# Patient Record
Sex: Female | Born: 1985 | Race: Black or African American | Hispanic: No | Marital: Single | Smoking: Never smoker
Health system: Southern US, Community
[De-identification: ages and names within clinical notes are randomized; demographics above are authoritative.]

## PROBLEM LIST (undated history)

## (undated) DIAGNOSIS — F32A Depression, unspecified: Secondary | ICD-10-CM

## (undated) DIAGNOSIS — K5900 Constipation, unspecified: Secondary | ICD-10-CM

## (undated) DIAGNOSIS — F329 Major depressive disorder, single episode, unspecified: Secondary | ICD-10-CM

## (undated) DIAGNOSIS — Z87898 Personal history of other specified conditions: Secondary | ICD-10-CM

## (undated) HISTORY — DX: Personal history of other specified conditions: Z87.898

---
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
## 2006-01-02 ENCOUNTER — Ambulatory Visit: Payer: Self-pay | Admitting: Obstetrics and Gynecology

## 2006-01-02 ENCOUNTER — Encounter (INDEPENDENT_AMBULATORY_CARE_PROVIDER_SITE_OTHER): Payer: Self-pay | Admitting: *Deleted

## 2006-08-19 ENCOUNTER — Emergency Department (HOSPITAL_COMMUNITY): Admission: EM | Admit: 2006-08-19 | Discharge: 2006-08-19 | Payer: Self-pay | Admitting: Family Medicine

## 2006-09-01 ENCOUNTER — Emergency Department (HOSPITAL_COMMUNITY): Admission: EM | Admit: 2006-09-01 | Discharge: 2006-09-01 | Payer: Self-pay | Admitting: Emergency Medicine

## 2007-12-31 ENCOUNTER — Emergency Department (HOSPITAL_COMMUNITY): Admission: EM | Admit: 2007-12-31 | Discharge: 2007-12-31 | Payer: Self-pay | Admitting: Family Medicine

## 2008-05-07 ENCOUNTER — Emergency Department (HOSPITAL_COMMUNITY): Admission: EM | Admit: 2008-05-07 | Discharge: 2008-05-07 | Payer: Self-pay | Admitting: Emergency Medicine

## 2008-05-31 ENCOUNTER — Emergency Department (HOSPITAL_COMMUNITY): Admission: EM | Admit: 2008-05-31 | Discharge: 2008-05-31 | Payer: Self-pay | Admitting: Emergency Medicine

## 2009-03-31 ENCOUNTER — Emergency Department (HOSPITAL_COMMUNITY): Admission: EM | Admit: 2009-03-31 | Discharge: 2009-03-31 | Payer: Self-pay | Admitting: Emergency Medicine

## 2009-06-16 ENCOUNTER — Emergency Department (HOSPITAL_COMMUNITY): Admission: EM | Admit: 2009-06-16 | Discharge: 2009-06-16 | Payer: Self-pay | Admitting: Emergency Medicine

## 2009-08-14 ENCOUNTER — Emergency Department (HOSPITAL_COMMUNITY): Admission: EM | Admit: 2009-08-14 | Discharge: 2009-08-14 | Payer: Self-pay | Admitting: Family Medicine

## 2009-11-21 IMAGING — CR DG ABDOMEN 2V
2 series · 2 of 2 positions shown · non-contrast
Comparison: 09/01/2006

CLINICAL DATA: Constipation.

ABDOMEN - 2 VIEW

[w abdomen upright]
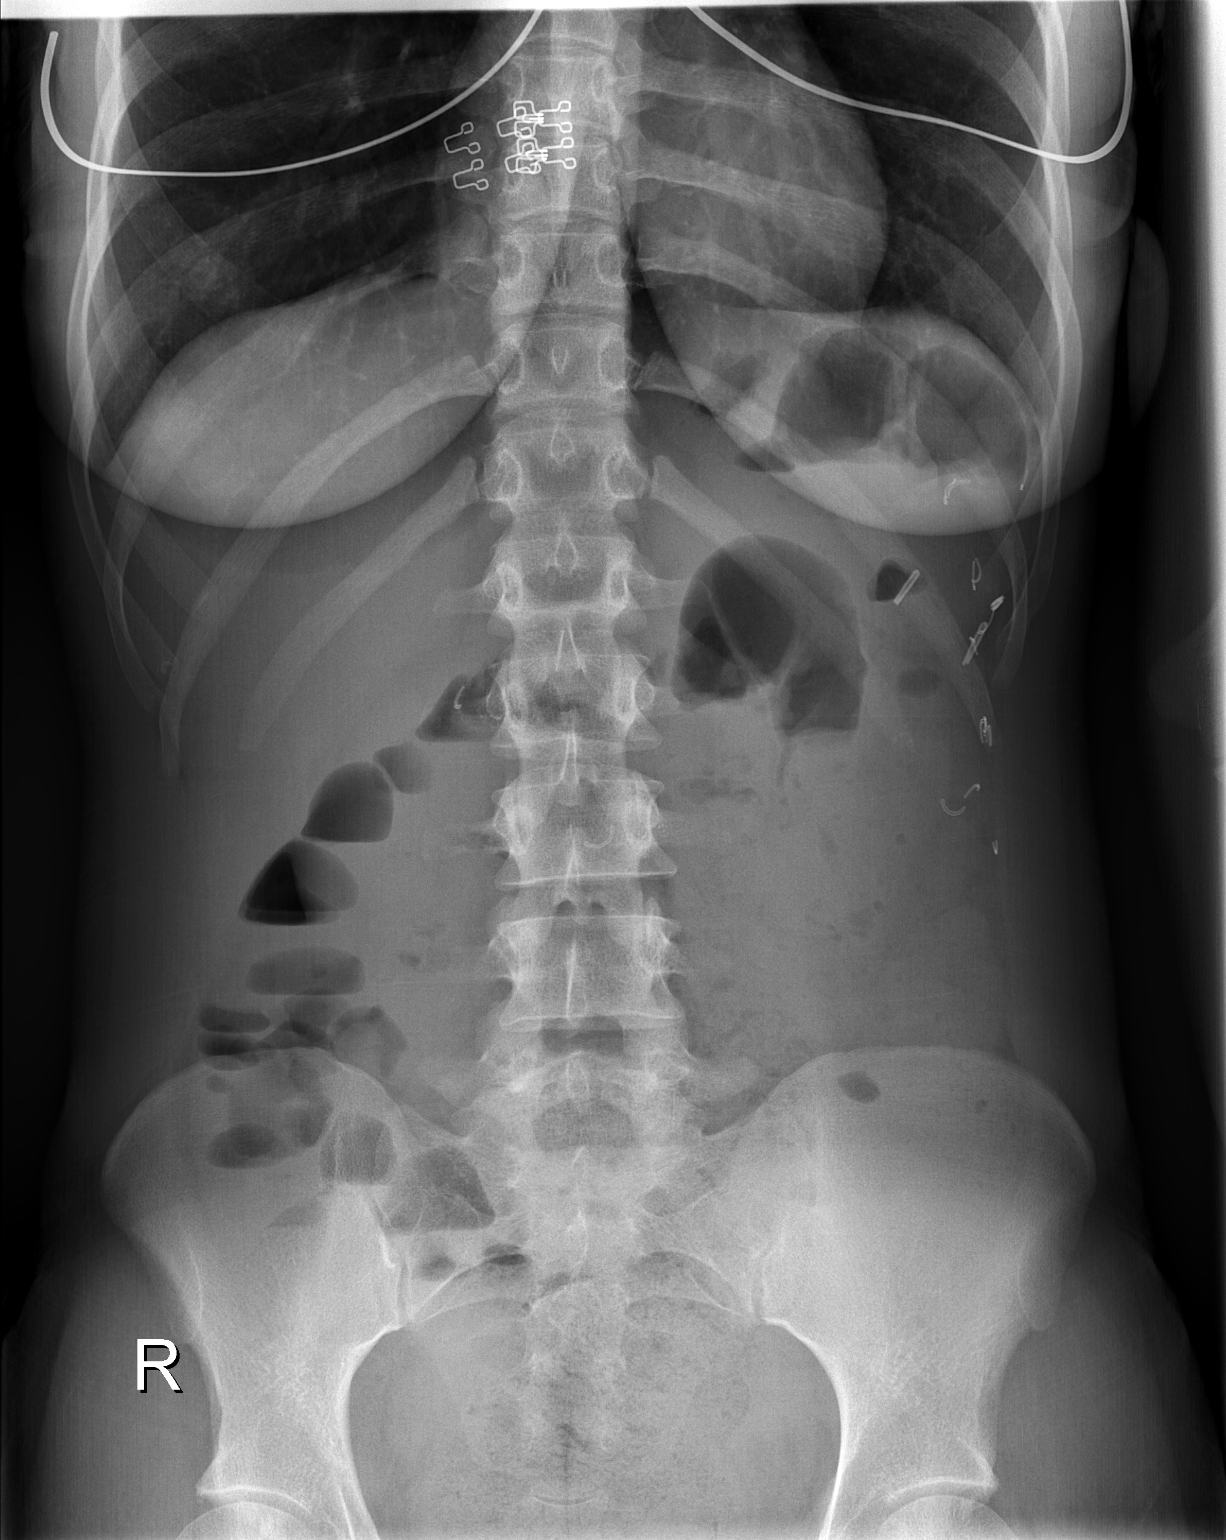

[t abdomen supine]
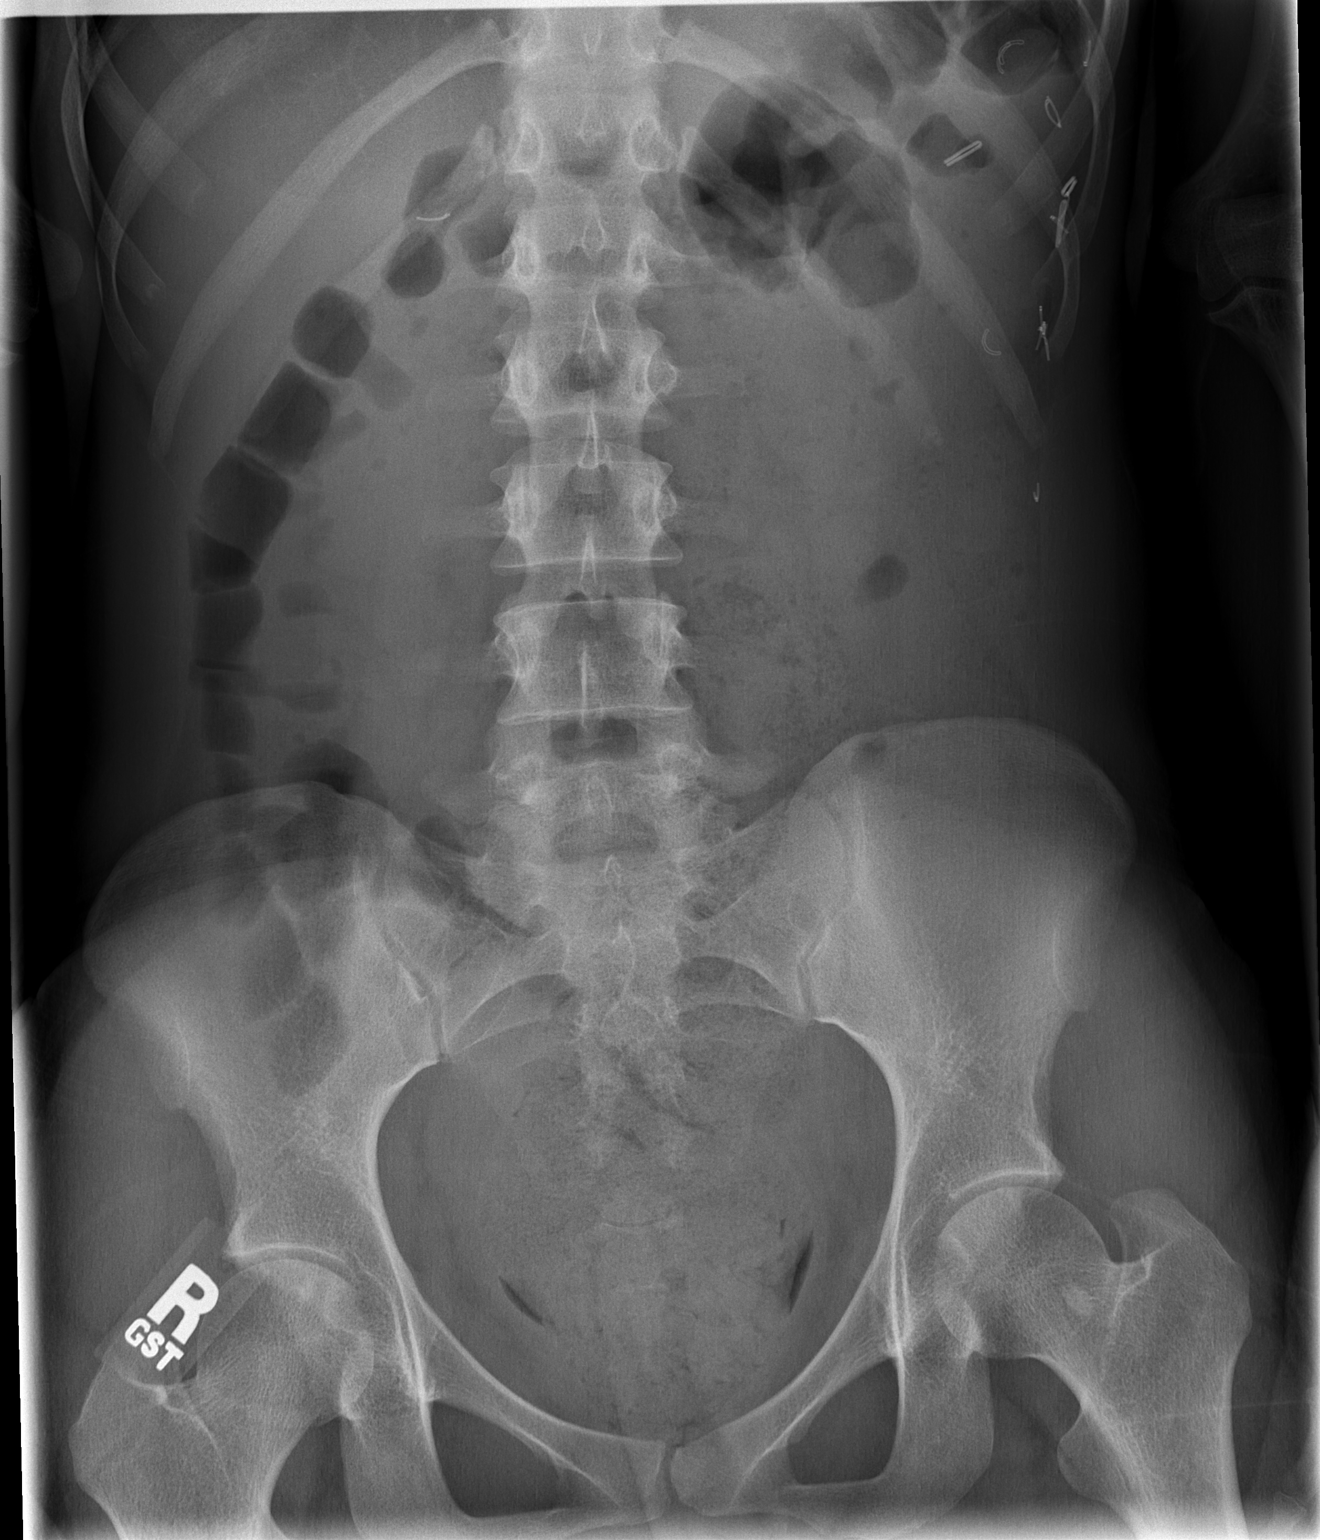

[2 of 2 positions shown; findings below may reference images not displayed]

FINDINGS: Postsurgical changes are present in the left upper
quadrant.  Air fluid levels are present in the right lower
quadrant.  Large amount of stool is present within the rectum and
sigmoid colon.  This raises the possibility of fecal impaction and
obstipation. Based on the appearance, obstruction is difficult to
exclude.
IMPRESSION: 1.  Large stool ball within the rectum.
2.  Postsurgical changes left upper quadrant.
3.  Colonic air fluid levels which are abnormal.  In the setting of
a large fecal burden, question obstipation.

## 2010-07-03 LAB — POCT URINALYSIS DIP (DEVICE)
Bilirubin Urine: NEGATIVE
Glucose, UA: NEGATIVE mg/dL
Ketones, ur: NEGATIVE mg/dL
pH: 6 (ref 5.0–8.0)

## 2010-07-08 LAB — POCT URINALYSIS DIP (DEVICE)
Bilirubin Urine: NEGATIVE
Glucose, UA: NEGATIVE mg/dL
Ketones, ur: NEGATIVE mg/dL
pH: 7 (ref 5.0–8.0)

## 2010-07-08 LAB — POCT PREGNANCY, URINE: Preg Test, Ur: NEGATIVE

## 2010-07-16 LAB — POCT URINALYSIS DIP (DEVICE)
Glucose, UA: NEGATIVE mg/dL
Nitrite: NEGATIVE
pH: 7 (ref 5.0–8.0)

## 2010-07-16 LAB — POCT PREGNANCY, URINE: Preg Test, Ur: NEGATIVE

## 2010-07-30 LAB — POCT URINALYSIS DIP (DEVICE)
Bilirubin Urine: NEGATIVE
Glucose, UA: NEGATIVE mg/dL
Nitrite: POSITIVE — AB
Urobilinogen, UA: 0.2 mg/dL (ref 0.0–1.0)

## 2010-07-30 LAB — POCT PREGNANCY, URINE: Preg Test, Ur: NEGATIVE

## 2010-08-31 NOTE — Group Therapy Note (Signed)
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  She has been on Ortho Tri-Cyclen  since high school and has never had a problem. In the last month, she has  been having a brownish discharge without odor or irritation.  She seems due  for a new Pap smear.  The external genitalia is normal.  BUS within normal  limits.  Vagina is clean and well rugated with a slight white creamy  discharge but no sign of any bleeding or breakthrough bleeding.  The cervix  is nulliparous.  The uterus is anterior, normal size, shape, and  consistency.  The adnexa is normal.   IMPRESSION:  Probable light breakthrough bleeding. Since the patient has  been on the medication for this long, I am going to step her up to Ortho-  Novum 135 for a couple months to see if that takes care it.  Meanwhile, we  did a Pap smear and a wet prep just to rule out any type of infection that  may be adding to the problem.   IMPRESSION:  Mild breakthrough bleeding, normal gynecological examination.           ______________________________  Argentina Donovan, MD     PR/MEDQ  D:  01/02/2006  T:  01/04/2006  Job:  [id number]

## 2010-09-07 ENCOUNTER — Inpatient Hospital Stay (INDEPENDENT_AMBULATORY_CARE_PROVIDER_SITE_OTHER)
Admission: RE | Admit: 2010-09-07 | Discharge: 2010-09-07 | Disposition: A | Discharge status: Home / Self Care | Payer: BC Managed Care – PPO | Source: Ambulatory Visit | Attending: Family Medicine | Admitting: Family Medicine

## 2010-09-07 DIAGNOSIS — N760 Acute vaginitis: Secondary | ICD-10-CM

## 2010-09-07 DIAGNOSIS — A499 Bacterial infection, unspecified: Secondary | ICD-10-CM

## 2010-09-07 LAB — POCT URINALYSIS DIP (DEVICE)
Bilirubin Urine: NEGATIVE
Glucose, UA: NEGATIVE mg/dL
Protein, ur: NEGATIVE mg/dL
Specific Gravity, Urine: 1.025 (ref 1.005–1.030)
Urobilinogen, UA: 0.2 mg/dL (ref 0.0–1.0)
pH: 5.5 (ref 5.0–8.0)

## 2010-09-07 LAB — WET PREP, GENITAL: Yeast Wet Prep HPF POC: NONE SEEN

## 2010-09-08 LAB — GC/CHLAMYDIA PROBE AMP, GENITAL: GC Probe Amp, Genital: NEGATIVE

## 2010-10-16 HISTORY — PX: COLPOSCOPY: SHX161

## 2011-05-17 HISTORY — PX: COLPOSCOPY: SHX161

## 2012-09-14 ENCOUNTER — Other Ambulatory Visit: Payer: Self-pay | Admitting: Nurse Practitioner

## 2012-09-30 ENCOUNTER — Encounter: Payer: Self-pay | Admitting: *Deleted

## 2012-10-15 ENCOUNTER — Ambulatory Visit: Payer: Self-pay | Admitting: Nurse Practitioner

## 2012-11-19 ENCOUNTER — Encounter: Payer: Self-pay | Admitting: Nurse Practitioner

## 2012-11-19 ENCOUNTER — Ambulatory Visit (INDEPENDENT_AMBULATORY_CARE_PROVIDER_SITE_OTHER): Payer: BC Managed Care – PPO | Admitting: Nurse Practitioner

## 2012-11-19 VITALS — BP 112/70 | HR 104 | Ht 62.75 in | Wt 109.0 lb

## 2012-11-19 DIAGNOSIS — Z0000 Encounter for general adult medical examination without abnormal findings: Secondary | ICD-10-CM

## 2012-11-19 DIAGNOSIS — Z01419 Encounter for gynecological examination (general) (routine) without abnormal findings: Secondary | ICD-10-CM

## 2012-11-19 DIAGNOSIS — Z113 Encounter for screening for infections with a predominantly sexual mode of transmission: Secondary | ICD-10-CM

## 2012-11-19 LAB — HEMOGLOBIN, FINGERSTICK: Hemoglobin, fingerstick: 13.6 g/dL (ref 12.0–16.0)

## 2012-11-19 MED ORDER — NORETHIN ACE-ETH ESTRAD-FE 1-20 MG-MCG PO TABS: 1.0000 | ORAL_TABLET | Freq: Every day | ORAL | Status: DC

## 2012-11-19 NOTE — Progress Notes (Signed)
Patient ID: Elizabeth Buck, female   DOB: [birth year], 27 y.o.   MRN: [id number] 27 y.o. G0P0 Single African American Fe here for annual exam. Menses for 7 days - back to having cramps and heavy flow for 3 days.  Not sexually active for 4 months.  Wants STD's.  Wants to restart OCP but concerned about price of pills. No urinary symptoms.  Patient's last menstrual period was 11/11/2012.          Sexually active: no  The current method of family planning is abstinence and if SA using condoms.                                                                           Exercising: no  The patient does not participate in regular exercise at present. 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 She has never used smokeless tobacco. She reports that she drinks about 0.5 ounces of alcohol per week. She reports that she does not use illicit drugs.  Past Medical History  Diagnosis Date  . 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 Colposcopy  10/16/2010    CIN III  . Colposcopy  05/17/2011    CIN I    Current Outpatient Prescriptions  Medication Sig Dispense Refill  . Multiple Vitamin (MULTIVITAMIN) tablet Take 1 tablet by mouth daily.      . norethindrone-ethinyl estradiol (JUNEL FE,GILDESS FE,LOESTRIN FE) 1-20 MG-MCG tablet Take 1 tablet by mouth daily.  3 Package  3   No current facility-administered medications for this visit.    Family History  Problem Relation Age of Onset  . Breast cancer Maternal Grandmother   . Diabetes Maternal Grandmother   . CVA Maternal Grandfather   . Hypertension Maternal Grandfather     ROS:  Pertinent items are noted in HPI.  Otherwise, a comprehensive ROS was negative.  Exam:   BP 112/70   Pulse 104  Ht 5' 2.75" (1.594 m)  Wt 109 lb (49.442 kg)  BMI 19.46 kg/m2  LMP 11/11/2012 Height: 5' 2.75" (159.4 cm)  Ht Readings from Last 3 Encounters:  11/19/12 5' 2.75" (1.594 m)    General appearance: alert, cooperative and appears stated age Head: Normocephalic, without obvious abnormality, atraumatic Neck: no adenopathy, supple, symmetrical, trachea midline and thyroid normal to inspection and palpation Lungs: clear to auscultation bilaterally Breasts: normal appearance, no masses or tenderness Heart: regular rate and rhythm Abdomen: soft, non-tender; no masses,  no organomegaly Extremities: extremities normal, atraumatic, no cyanosis or edema Skin: Skin color, texture, turgor normal. No rashes or lesions Lymph nodes: Cervical, supraclavicular, and axillary nodes normal. No abnormal inguinal nodes palpated Neurologic: Grossly normal   Pelvic: External genitalia:  no lesions              Urethra:  normal appearing urethra with no masses, tenderness or lesions  Bartholin's and Skene's: normal                 Vagina: normal appearing vagina with normal color and discharge, no lesions              Cervix: anteverted              Pap taken: yes Bimanual Exam:  Uterus:  normal size, contour, position, consistency, mobility, non-tender              Adnexa: no mass, fullness, tenderness               Rectovaginal: Confirms               Anus:  normal sphincter tone, no lesions  A:  Well Woman with normal exam  Condoms for contraception currently  History of menorrhagia  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  If she finds this generic is not covered on insurance to call us back.  counseled on breast self exam, STD prevention, adequate intake of calcium and vitamin D, diet and exercise return annually or  prn  An After Visit Summary was printed and given to the patient.

## 2012-11-19 NOTE — Patient Instructions (Addendum)
General topics  Next pap or exam is  due in 1 year Take a Women's multivitamin Take 1200 mg. of calcium daily - prefer dietary If any concerns in interim to call back  Breast Self-Awareness Practicing breast self-awareness may pick up problems early, prevent significant medical complications, and possibly save your life. By practicing breast self-awareness, you can become familiar with how your breasts look and feel and if your breasts are changing. This allows you to notice changes early. It can also offer you some reassurance that your breast health is good. One way to learn what is normal for your breasts and whether your breasts are changing is to do a breast self-exam. If you find a lump or something that was not present in the past, it is best to contact your caregiver right away. Other findings that should be evaluated by your caregiver include nipple discharge, especially if it is bloody; skin changes or reddening; areas where the skin seems to be pulled in (retracted); or new lumps and bumps. Breast pain is seldom associated with cancer (malignancy), but should also be evaluated by a caregiver. BREAST SELF-EXAM The best time to examine your breasts is 5 7 days after your menstrual period is over.  ExitCare Patient Information 2013 [employer].   Exercise to Stay Healthy Exercise helps you become and stay healthy. EXERCISE IDEAS AND TIPS Choose exercises that:  You enjoy.  Fit into your day. You do not need to exercise really hard to be healthy. You can do exercises at a slow or medium level and stay healthy. You can:  Stretch before and after working out.  Try yoga, Pilates, or tai chi.  Lift weights.  Walk fast, swim, jog, run, climb stairs, bicycle, dance, or rollerskate.  Take aerobic classes. Exercises that burn about 150 calories:  Running 1  miles in 15 minutes.  Playing volleyball for 45 to 60 minutes.  Washing and waxing a car for 45 to 60  minutes.  Playing touch football for 45 minutes.  Walking 1  miles in 35 minutes.  Pushing a stroller 1  miles in 30 minutes.  Playing basketball for 30 minutes.  Raking leaves for 30 minutes.  Bicycling 5 miles in 30 minutes.  Walking 2 miles in 30 minutes.  Dancing for 30 minutes.  Shoveling snow for 15 minutes.  Swimming laps for 20 minutes.  Walking up stairs for 15 minutes.  Bicycling 4 miles in 15 minutes.  Gardening for 30 to 45 minutes.  Jumping rope for 15 minutes.  Washing windows or floors for 45 to 60 minutes. Document Released: 05/04/2010 Document Revised: 06/24/2011 Document Reviewed: 05/04/2010 ExitCare Patient Information 2013 [employer].   Other topics ( that may be useful information):    Sexually Transmitted Disease Sexually transmitted disease (STD) refers to any infection that is passed from person to person during sexual activity. This may happen by way of saliva, semen, blood, vaginal mucus, or urine. Common STDs include:  Gonorrhea.  Chlamydia.  Syphilis.  HIV/AIDS.  Genital herpes.  Hepatitis B and C.  Trichomonas.  Human papillomavirus (HPV).  Pubic lice. CAUSES  An STD may be spread by bacteria, virus, or parasite. A person can get an STD by:  Sexual intercourse with an infected person.  Sharing sex toys with an infected person.  Sharing needles with an infected person.  Having intimate contact with the genitals, mouth, or rectal areas of an infected person. SYMPTOMS  Some people may not have any symptoms, but   they can still pass the infection to others. Different STDs have different symptoms. Symptoms include:  Painful or bloody urination.  Pain in the pelvis, abdomen, vagina, anus, throat, or eyes.  Skin rash, itching, irritation, growths, or sores (lesions). These usually occur in the genital or anal area.  Abnormal vaginal discharge.  Penile discharge in men.  Soft, flesh-colored skin growths in the  genital or anal area.  Fever.  Pain or bleeding during sexual intercourse.  Swollen glands in the groin area.  Yellow skin and eyes (jaundice). This is seen with hepatitis. DIAGNOSIS  To make a diagnosis, your caregiver may:  Take a medical history.  Perform a physical exam.  Take a specimen (culture) to be examined.  Examine a sample of discharge under a microscope.  Perform blood test TREATMENT   Chlamydia, gonorrhea, trichomonas, and syphilis can be cured with antibiotic medicine.  Genital herpes, hepatitis, and HIV can be treated, but not cured, with prescribed medicines. The medicines will lessen the symptoms.  Genital warts from HPV can be treated with medicine or by freezing, burning (electrocautery), or surgery. Warts may come back.  HPV is a virus and cannot be cured with medicine or surgery.However, abnormal areas may be followed very closely by your caregiver and may be removed from the cervix, vagina, or vulva through office procedures or surgery. If your diagnosis is confirmed, your recent sexual partners need treatment. This is true even if they are symptom-free or have a negative culture or evaluation. They should not have sex until their caregiver says it is okay. HOME CARE INSTRUCTIONS  All sexual partners should be informed, tested, and treated for all STDs.  Take your antibiotics as directed. Finish them even if you start to feel better.  Only take over-the-counter or prescription medicines for pain, discomfort, or fever as directed by your caregiver.  Rest.  Eat a balanced diet and drink enough fluids to keep your urine clear or pale yellow.  Do not have sex until treatment is completed and you have followed up with your caregiver. STDs should be checked after treatment.  Keep all follow-up appointments, Pap tests, and blood tests as directed by your caregiver.  Only use latex condoms and water-soluble lubricants during sexual activity. Do not use  petroleum jelly or oils.  Avoid alcohol and illegal drugs.  Get vaccinated for HPV and hepatitis. If you have not received these vaccines in the past, talk to your caregiver about whether one or both might be right for you.  Avoid risky sex practices that can break the skin. The only way to avoid getting an STD is to avoid all sexual activity.Latex condoms and dental dams (for oral sex) will help lessen the risk of getting an STD, but will not completely eliminate the risk. SEEK MEDICAL CARE IF:   You have a fever.  You have any new or worsening symptoms. Document Released: 06/22/2002 Document Revised: 06/24/2011 Document Reviewed: 06/29/2010 ExitCare Patient Information 2013 [employer].    Domestic Abuse You are being battered or abused if someone close to you hits, pushes, or physically hurts you in any way. You also are being abused if you are forced into activities. You are being sexually abused if you are forced to have sexual contact of any kind. You are being emotionally abused if you are made to feel worthless or if you are constantly threatened. It is important to remember that help is available. No one has the right to abuse you. PREVENTION OF FURTHER   ABUSE  Learn the warning signs of danger. This varies with situations but may include: the use of alcohol, threats, isolation from friends and family, or forced sexual contact. Leave if you feel that violence is going to occur.  If you are attacked or beaten, report it to the police so the abuse is documented. You do not have to press charges. The police can protect you while you or the attackers are leaving. Get the officer's name and badge number and a copy of the report.  Find someone you can trust and tell them what is happening to you: your caregiver, a nurse, clergy member, close friend or family member. Feeling ashamed is natural, but remember that you have done nothing wrong. No one deserves abuse. Document Released:  03/29/2000 Document Revised: 06/24/2011 Document Reviewed: 06/07/2010 ExitCare Patient Information 2013 [employer].    How Much is Too Much Alcohol? Drinking too much alcohol can cause injury, accidents, and health problems. These types of problems can include:   Car crashes.  Falls.  Family fighting (domestic violence).  Drowning.  Fights.  Injuries.  Burns.  Damage to certain organs.  Having a baby with birth defects. ONE DRINK CAN BE TOO MUCH WHEN YOU ARE:  Working.  Pregnant or breastfeeding.  Taking medicines. Ask your doctor.  Driving or planning to drive. If you or someone you know has a drinking problem, get help from a doctor.  Document Released: 01/26/2009 Document Revised: 06/24/2011 Document Reviewed: 01/26/2009 ExitCare Patient Information 2013 [employer].   Smoking Hazards Smoking cigarettes is extremely bad for your health. Tobacco smoke has over 200 known poisons in it. There are over 60 chemicals in tobacco smoke that cause cancer. Some of the chemicals found in cigarette smoke include:   Cyanide.  Benzene.  Formaldehyde.  Methanol (wood alcohol).  Acetylene (fuel used in welding torches).  Ammonia. Cigarette smoke also contains the poisonous gases nitrogen oxide and carbon monoxide.  Cigarette smokers have an increased risk of many serious medical problems and Smoking causes approximately:  90% of all lung cancer deaths in men.  80% of all lung cancer deaths in women.  90% of deaths from chronic obstructive lung disease. Compared with nonsmokers, smoking increases the risk of:  Coronary heart disease by 2 to 4 times.  Stroke by 2 to 4 times.  Men developing lung cancer by 23 times.  Women developing lung cancer by 13 times.  Dying from chronic obstructive lung diseases by 12 times.  . Smoking is the most preventable cause of death and disease in our society.  WHY IS SMOKING ADDICTIVE?  Nicotine is the chemical  agent in tobacco that is capable of causing addiction or dependence.  When you smoke and inhale, nicotine is absorbed rapidly into the bloodstream through your lungs. Nicotine absorbed through the lungs is capable of creating a powerful addiction. Both inhaled and non-inhaled nicotine may be addictive.  Addiction studies of cigarettes and spit tobacco show that addiction to nicotine occurs mainly during the teen years, when young people begin using tobacco products. WHAT ARE THE BENEFITS OF QUITTING?  There are many health benefits to quitting smoking.   Likelihood of developing cancer and heart disease decreases. Health improvements are seen almost immediately.  Blood pressure, pulse rate, and breathing patterns start returning to normal soon after quitting. QUITTING SMOKING   American Lung Association - 1-800-LUNGUSA  American Cancer Society - 1-800-ACS-2345 Document Released: 05/09/2004 Document Revised: 06/24/2011 Document Reviewed: 01/11/2009 ExitCare Patient Information 2013 [employer].   Stress Management Stress is a state of physical or mental tension that often results from changes in your life or normal routine. Some common causes of stress are:  Death of a loved one.  Injuries or severe illnesses.  Getting fired or changing jobs.  Moving into a new home. Other causes may be:  Sexual problems.  Business or financial losses.  Taking on a large debt.  Regular conflict with someone at home or at work.  Constant tiredness from lack of sleep. It is not just bad things that are stressful. It may be stressful to:  Win the lottery.  Get married.  Buy a new car. The amount of stress that can be easily tolerated varies from person to person. Changes generally cause stress, regardless of the types of change. Too much stress can affect your health. It may lead to physical or emotional problems. Too little stress (boredom) may also become stressful. SUGGESTIONS TO  REDUCE STRESS:  Talk things over with your family and friends. It often is helpful to share your concerns and worries. If you feel your problem is serious, you may want to get help from a professional counselor.  Consider your problems one at a time instead of lumping them all together. Trying to take care of everything at once may seem impossible. List all the things you need to do and then start with the most important one. Set a goal to accomplish 2 or 3 things each day. If you expect to do too many in a single day you will naturally fail, causing you to feel even more stressed.  Do not use alcohol or drugs to relieve stress. Although you may feel better for a short time, they do not remove the problems that caused the stress. They can also be habit forming.  Exercise regularly - at least 3 times per week. Physical exercise can help to relieve that "uptight" feeling and will relax you.  The shortest distance between despair and hope is often a good night's sleep.  Go to bed and get up on time allowing yourself time for appointments without being rushed.  Take a short "time-out" period from any stressful situation that occurs during the day. Close your eyes and take some deep breaths. Starting with the muscles in your face, tense them, hold it for a few seconds, then relax. Repeat this with the muscles in your neck, shoulders, hand, stomach, back and legs.  Take good care of yourself. Eat a balanced diet and get plenty of rest.  Schedule time for having fun. Take a break from your daily routine to relax. HOME CARE INSTRUCTIONS   Call if you feel overwhelmed by your problems and feel you can no longer manage them on your own.  Return immediately if you feel like hurting yourself or someone else. Document Released: 09/25/2000 Document Revised: 06/24/2011 Document Reviewed: 05/18/2007 ExitCare Patient Information 2013 [employer].   

## 2012-11-20 LAB — STD PANEL: HIV: NONREACTIVE

## 2012-11-20 NOTE — Progress Notes (Signed)
Encounter reviewed by Dr. Tristine Langi Silva.  

## 2012-11-26 ENCOUNTER — Telehealth: Payer: Self-pay | Admitting: *Deleted

## 2012-11-26 NOTE — Telephone Encounter (Signed)
Message copied by Osie Bond on Thu Nov 26, 2012  9:06 AM ------      Message from: Ria Comment R      Created: Tue Nov 24, 2012  6:18 PM       Let patient know results ------

## 2012-11-26 NOTE — Telephone Encounter (Signed)
Pt is aware of negative lab results.  

## 2012-11-27 ENCOUNTER — Telehealth: Payer: Self-pay | Admitting: *Deleted

## 2012-11-27 NOTE — Telephone Encounter (Signed)
Message copied by Osie Bond on Fri Nov 27, 2012  3:50 PM ------      Message from: Ria Comment R      Created: Thu Nov 26, 2012  6:08 PM       Let patient know lab results. pap 02 ------

## 2012-11-27 NOTE — Telephone Encounter (Signed)
Pt is aware of all negative lab results.  

## 2012-12-07 ENCOUNTER — Encounter (HOSPITAL_COMMUNITY): Payer: Self-pay | Admitting: Emergency Medicine

## 2012-12-07 DIAGNOSIS — Z8719 Personal history of other diseases of the digestive system: Secondary | ICD-10-CM | POA: Insufficient documentation

## 2012-12-07 DIAGNOSIS — K5641 Fecal impaction: Secondary | ICD-10-CM | POA: Insufficient documentation

## 2012-12-07 DIAGNOSIS — Z9889 Other specified postprocedural states: Secondary | ICD-10-CM | POA: Insufficient documentation

## 2012-12-07 DIAGNOSIS — R109 Unspecified abdominal pain: Secondary | ICD-10-CM | POA: Insufficient documentation

## 2012-12-07 DIAGNOSIS — K5900 Constipation, unspecified: Secondary | ICD-10-CM | POA: Insufficient documentation

## 2012-12-07 NOTE — ED Notes (Signed)
PT. REPORTS CONSTIPATION FOR 3 DAYS UNRELIEVED BY OTC LAXATIVES AND STOOL SOFTENER , DENIES NAUSEA OR VOMITTING , NO FEVER OR CHILLS, LAST BM Saturday.

## 2012-12-08 ENCOUNTER — Emergency Department (HOSPITAL_COMMUNITY)
Admission: EM | Admit: 2012-12-08 | Discharge: 2012-12-08 | Disposition: A | Discharge status: Home / Self Care | Payer: BC Managed Care – PPO | Attending: Emergency Medicine | Admitting: Emergency Medicine

## 2012-12-08 DIAGNOSIS — K5900 Constipation, unspecified: Secondary | ICD-10-CM

## 2012-12-08 DIAGNOSIS — K5641 Fecal impaction: Secondary | ICD-10-CM

## 2012-12-08 HISTORY — DX: Constipation, unspecified: K59.00

## 2012-12-08 MED ORDER — FLEET ENEMA 7-19 GM/118ML RE ENEM: 1.0000 | ENEMA | Freq: Every day | RECTAL | Status: DC | PRN

## 2012-12-08 MED ORDER — POLYETHYLENE GLYCOL 3350 17 GM/SCOOP PO POWD: 17.0000 g | Freq: Every day | ORAL | Status: DC

## 2012-12-08 MED ORDER — FLEET ENEMA 7-19 GM/118ML RE ENEM
1.0000 | ENEMA | Freq: Once | RECTAL | Status: AC
  Administered 2012-12-08: 1 via RECTAL
  Filled 2012-12-08: qty 1

## 2012-12-08 NOTE — ED Notes (Signed)
Pt here with constipation since Saturday;  sts she has a hx of being impacted and had to be disimpacted last time this occurred;  sts she has taken laxative and stool softeners with no relief.
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
## 2012-12-09 NOTE — ED Provider Notes (Signed)
Medical screening examination/treatment/procedure(s) were performed by non-physician practitioner and as supervising physician I was immediately available for consultation/collaboration.   Jasmine Awe, MD 12/09/12 [phone]

## 2013-03-18 ENCOUNTER — Encounter (HOSPITAL_BASED_OUTPATIENT_CLINIC_OR_DEPARTMENT_OTHER): Payer: Self-pay | Admitting: Emergency Medicine

## 2013-03-18 ENCOUNTER — Emergency Department (HOSPITAL_BASED_OUTPATIENT_CLINIC_OR_DEPARTMENT_OTHER)
Admission: EM | Admit: 2013-03-18 | Discharge: 2013-03-18 | Disposition: A | Discharge status: Home / Self Care | Payer: BC Managed Care – PPO | Attending: Emergency Medicine | Admitting: Emergency Medicine

## 2013-03-18 ENCOUNTER — Emergency Department (HOSPITAL_BASED_OUTPATIENT_CLINIC_OR_DEPARTMENT_OTHER): Payer: BC Managed Care – PPO

## 2013-03-18 DIAGNOSIS — F329 Major depressive disorder, single episode, unspecified: Secondary | ICD-10-CM | POA: Diagnosis not present

## 2013-03-18 DIAGNOSIS — Z79899 Other long term (current) drug therapy: Secondary | ICD-10-CM | POA: Insufficient documentation

## 2013-03-18 DIAGNOSIS — R51 Headache: Secondary | ICD-10-CM | POA: Diagnosis present

## 2013-03-18 DIAGNOSIS — K5900 Constipation, unspecified: Secondary | ICD-10-CM | POA: Insufficient documentation

## 2013-03-18 DIAGNOSIS — G44309 Post-traumatic headache, unspecified, not intractable: Secondary | ICD-10-CM | POA: Insufficient documentation

## 2013-03-18 DIAGNOSIS — F3289 Other specified depressive episodes: Secondary | ICD-10-CM | POA: Insufficient documentation

## 2013-03-18 HISTORY — DX: Major depressive disorder, single episode, unspecified: F32.9

## 2013-03-18 HISTORY — DX: Depression, unspecified: F32.A

## 2013-03-18 MED ORDER — TRAMADOL HCL 50 MG PO TABS: 50.0000 mg | ORAL_TABLET | Freq: Four times a day (QID) | ORAL | Status: DC | PRN
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
## 2013-09-18 ENCOUNTER — Encounter (HOSPITAL_COMMUNITY): Payer: Self-pay | Admitting: Emergency Medicine

## 2013-09-18 ENCOUNTER — Emergency Department (HOSPITAL_COMMUNITY)
Admission: EM | Admit: 2013-09-18 | Discharge: 2013-09-18 | Disposition: A | Discharge status: Home / Self Care | Payer: BC Managed Care – PPO | Source: Home / Self Care | Attending: Family Medicine | Admitting: Family Medicine

## 2013-09-18 DIAGNOSIS — N390 Urinary tract infection, site not specified: Secondary | ICD-10-CM

## 2013-09-18 LAB — POCT URINALYSIS DIP (DEVICE)
BILIRUBIN URINE: NEGATIVE
Glucose, UA: NEGATIVE mg/dL
Ketones, ur: NEGATIVE mg/dL
NITRITE: POSITIVE — AB
PH: 7.5 (ref 5.0–8.0)
PROTEIN: 100 mg/dL — AB
Specific Gravity, Urine: 1.02 (ref 1.005–1.030)
Urobilinogen, UA: 1 mg/dL (ref 0.0–1.0)

## 2013-09-18 LAB — POCT PREGNANCY, URINE: Preg Test, Ur: NEGATIVE

## 2013-09-18 MED ORDER — NITROFURANTOIN MONOHYD MACRO 100 MG PO CAPS: 100.0000 mg | ORAL_CAPSULE | Freq: Two times a day (BID) | ORAL | Status: DC

## 2013-09-18 MED ORDER — PHENAZOPYRIDINE HCL 200 MG PO TABS: 200.0000 mg | ORAL_TABLET | Freq: Three times a day (TID) | ORAL | Status: DC

## 2013-09-18 NOTE — Discharge Instructions (Signed)
Please review the information below. Take medications as directed and be sure to finish the antibiotic. Drink lots of water every day. If symptoms persist after completion of antibiotic please arrange follow up with your GYN MD for further evaluation.   Urinary Tract Infection A urinary tract infection (UTI) can occur any place along the urinary tract. The tract includes the kidneys, ureters, bladder, and urethra. A type of germ called bacteria often causes a UTI. UTIs are often helped with antibiotic medicine.  HOME CARE   If given, take antibiotics as told by your doctor. Finish them even if you start to feel better.  Drink enough fluids to keep your pee (urine) clear or pale yellow.  Avoid tea, drinks with caffeine, and bubbly (carbonated) drinks.  Pee often. Avoid holding your pee in for a long time.  Pee before and after having sex (intercourse).  Wipe from front to back after you poop (bowel movement) if you are a woman. Use each tissue only once. GET HELP RIGHT AWAY IF:   You have back pain.  You have lower belly (abdominal) pain.  You have chills.  You feel sick to your stomach (nauseous).  You throw up (vomit).  Your burning or discomfort with peeing does not go away.  You have a fever.  Your symptoms are not better in 3 days. MAKE SURE YOU:   Understand these instructions.  Will watch your condition.  Will get help right away if you are not doing well or get worse. Document Released: 09/18/2007 Document Revised: 12/25/2011 Document Reviewed: 10/31/2011 [facility] Patient Information 2014 [place], [place].

## 2013-09-18 NOTE — ED Notes (Signed)
Pt  Reports  Symptoms  Of      Dysuria  Frequency  As  Well  As  Foul  Smelling  Urine       X  3  Days       denys  Any bleeding or  Discharge       Pt  Reports   Feels  Like  Symptoms  When she  Had a  Prior  uti
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
## 2013-09-18 NOTE — ED Provider Notes (Signed)
Medical screening examination/treatment/procedure(s) were performed by a resident physician or non-physician practitioner and as the supervising physician I was immediately available for consultation/collaboration.  Evan Corey, MD    Evan S Corey, MD 09/18/13 1820 

## 2013-09-25 ENCOUNTER — Encounter (HOSPITAL_COMMUNITY): Payer: Self-pay | Admitting: *Deleted

## 2013-09-25 ENCOUNTER — Inpatient Hospital Stay (HOSPITAL_COMMUNITY)
Admission: AD | Admit: 2013-09-25 | Discharge: 2013-09-25 | Disposition: A | Discharge status: Home / Self Care | Payer: BC Managed Care – PPO | Source: Ambulatory Visit | Attending: Obstetrics and Gynecology | Admitting: Obstetrics and Gynecology

## 2013-09-25 DIAGNOSIS — Z803 Family history of malignant neoplasm of breast: Secondary | ICD-10-CM | POA: Insufficient documentation

## 2013-09-25 DIAGNOSIS — N390 Urinary tract infection, site not specified: Secondary | ICD-10-CM | POA: Insufficient documentation

## 2013-09-25 LAB — URINALYSIS, ROUTINE W REFLEX MICROSCOPIC
BILIRUBIN URINE: NEGATIVE
Glucose, UA: NEGATIVE mg/dL
Ketones, ur: NEGATIVE mg/dL
Nitrite: NEGATIVE
PROTEIN: 30 mg/dL — AB
Specific Gravity, Urine: 1.02 (ref 1.005–1.030)
UROBILINOGEN UA: 0.2 mg/dL (ref 0.0–1.0)
pH: 5.5 (ref 5.0–8.0)

## 2013-09-25 LAB — URINE MICROSCOPIC-ADD ON

## 2013-09-25 LAB — POCT PREGNANCY, URINE: Preg Test, Ur: NEGATIVE

## 2013-09-25 MED ORDER — SULFAMETHOXAZOLE-TMP DS 800-160 MG PO TABS: 1.0000 | ORAL_TABLET | Freq: Two times a day (BID) | ORAL | Status: DC

## 2013-09-25 NOTE — MAU Provider Note (Signed)
Attestation of Attending Supervision of Advanced Practitioner: Evaluation and management procedures were performed by the PA/NP/CNM/OB Fellow under my supervision/collaboration. Chart reviewed and agree with management and plan.  Joetta Delprado V 09/25/2013 6:11 PM   

## 2013-09-25 NOTE — MAU Note (Signed)
Patient presents with complaint that she went to Urgent Care last week and was diagnosed with a UTI and given a prescription. Spilled prescription bottle in her car and thought she had taken all of the medicine but still having UTI problems. Discovered that she has, in fact, not finished her medicine.
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
## 2013-09-27 LAB — URINE CULTURE

## 2013-09-28 ENCOUNTER — Other Ambulatory Visit: Payer: Self-pay | Admitting: Family

## 2013-11-22 ENCOUNTER — Ambulatory Visit (INDEPENDENT_AMBULATORY_CARE_PROVIDER_SITE_OTHER): Payer: BC Managed Care – PPO | Admitting: Nurse Practitioner

## 2013-11-22 ENCOUNTER — Encounter: Payer: Self-pay | Admitting: Nurse Practitioner

## 2013-11-22 VITALS — BP 118/52 | HR 68 | Ht 62.25 in | Wt 117.0 lb

## 2013-11-22 DIAGNOSIS — Z0000 Encounter for general adult medical examination without abnormal findings: Secondary | ICD-10-CM

## 2013-11-22 DIAGNOSIS — Z01419 Encounter for gynecological examination (general) (routine) without abnormal findings: Secondary | ICD-10-CM

## 2013-11-22 DIAGNOSIS — Z113 Encounter for screening for infections with a predominantly sexual mode of transmission: Secondary | ICD-10-CM

## 2013-11-22 LAB — POCT URINALYSIS DIPSTICK
BILIRUBIN UA: NEGATIVE
GLUCOSE UA: NEGATIVE
Ketones, UA: NEGATIVE
Leukocytes, UA: NEGATIVE
Nitrite, UA: NEGATIVE
Protein, UA: NEGATIVE
RBC UA: NEGATIVE
Urobilinogen, UA: NEGATIVE
pH, UA: 6

## 2013-11-22 LAB — HEMOGLOBIN, FINGERSTICK: HEMOGLOBIN, FINGERSTICK: 11.9 g/dL — AB (ref 12.0–16.0)

## 2013-11-22 MED ORDER — NORETHIN ACE-ETH ESTRAD-FE 1-20 MG-MCG PO TABS: 1.0000 | ORAL_TABLET | Freq: Every day | ORAL | Status: AC

## 2013-11-22 NOTE — Patient Instructions (Signed)
General topics  Next pap or exam is  due in 1 year Take a Women's multivitamin Take 1200 mg. of calcium daily - prefer dietary If any concerns in interim to call back  Breast Self-Awareness Practicing breast self-awareness may pick up problems early, prevent significant medical complications, and possibly save your life. By practicing breast self-awareness, you can become familiar with how your breasts look and feel and if your breasts are changing. This allows you to notice changes early. It can also offer you some reassurance that your breast health is good. One way to learn what is normal for your breasts and whether your breasts are changing is to do a breast self-exam. If you find a lump or something that was not present in the past, it is best to contact your caregiver right away. Other findings that should be evaluated by your caregiver include nipple discharge, especially if it is bloody; skin changes or reddening; areas where the skin seems to be pulled in (retracted); or new lumps and bumps. Breast pain is seldom associated with cancer (malignancy), but should also be evaluated by a caregiver. BREAST SELF-EXAM The best time to examine your breasts is 5 7 days after your menstrual period is over.  ExitCare Patient Information 2013 [employer].   Exercise to Stay Healthy Exercise helps you become and stay healthy. EXERCISE IDEAS AND TIPS Choose exercises that:  You enjoy.  Fit into your day. You do not need to exercise really hard to be healthy. You can do exercises at a slow or medium level and stay healthy. You can:  Stretch before and after working out.  Try yoga, Pilates, or tai chi.  Lift weights.  Walk fast, swim, jog, run, climb stairs, bicycle, dance, or rollerskate.  Take aerobic classes. Exercises that burn about 150 calories:  Running 1  miles in 15 minutes.  Playing volleyball for 45 to 60 minutes.  Washing and waxing a car for 45 to 60  minutes.  Playing touch football for 45 minutes.  Walking 1  miles in 35 minutes.  Pushing a stroller 1  miles in 30 minutes.  Playing basketball for 30 minutes.  Raking leaves for 30 minutes.  Bicycling 5 miles in 30 minutes.  Walking 2 miles in 30 minutes.  Dancing for 30 minutes.  Shoveling snow for 15 minutes.  Swimming laps for 20 minutes.  Walking up stairs for 15 minutes.  Bicycling 4 miles in 15 minutes.  Gardening for 30 to 45 minutes.  Jumping rope for 15 minutes.  Washing windows or floors for 45 to 60 minutes. Document Released: 05/04/2010 Document Revised: 06/24/2011 Document Reviewed: 05/04/2010 ExitCare Patient Information 2013 [employer].   Other topics ( that may be useful information):    Sexually Transmitted Disease Sexually transmitted disease (STD) refers to any infection that is passed from person to person during sexual activity. This may happen by way of saliva, semen, blood, vaginal mucus, or urine. Common STDs include:  Gonorrhea.  Chlamydia.  Syphilis.  HIV/AIDS.  Genital herpes.  Hepatitis B and C.  Trichomonas.  Human papillomavirus (HPV).  Pubic lice. CAUSES  An STD may be spread by bacteria, virus, or parasite. A person can get an STD by:  Sexual intercourse with an infected person.  Sharing sex toys with an infected person.  Sharing needles with an infected person.  Having intimate contact with the genitals, mouth, or rectal areas of an infected person. SYMPTOMS  Some people may not have any symptoms, but   they can still pass the infection to others. Different STDs have different symptoms. Symptoms include:  Painful or bloody urination.  Pain in the pelvis, abdomen, vagina, anus, throat, or eyes.  Skin rash, itching, irritation, growths, or sores (lesions). These usually occur in the genital or anal area.  Abnormal vaginal discharge.  Penile discharge in men.  Soft, flesh-colored skin growths in the  genital or anal area.  Fever.  Pain or bleeding during sexual intercourse.  Swollen glands in the groin area.  Yellow skin and eyes (jaundice). This is seen with hepatitis. DIAGNOSIS  To make a diagnosis, your caregiver may:  Take a medical history.  Perform a physical exam.  Take a specimen (culture) to be examined.  Examine a sample of discharge under a microscope.  Perform blood test TREATMENT   Chlamydia, gonorrhea, trichomonas, and syphilis can be cured with antibiotic medicine.  Genital herpes, hepatitis, and HIV can be treated, but not cured, with prescribed medicines. The medicines will lessen the symptoms.  Genital warts from HPV can be treated with medicine or by freezing, burning (electrocautery), or surgery. Warts may come back.  HPV is a virus and cannot be cured with medicine or surgery.However, abnormal areas may be followed very closely by your caregiver and may be removed from the cervix, vagina, or vulva through office procedures or surgery. If your diagnosis is confirmed, your recent sexual partners need treatment. This is true even if they are symptom-free or have a negative culture or evaluation. They should not have sex until their caregiver says it is okay. HOME CARE INSTRUCTIONS  All sexual partners should be informed, tested, and treated for all STDs.  Take your antibiotics as directed. Finish them even if you start to feel better.  Only take over-the-counter or prescription medicines for pain, discomfort, or fever as directed by your caregiver.  Rest.  Eat a balanced diet and drink enough fluids to keep your urine clear or pale yellow.  Do not have sex until treatment is completed and you have followed up with your caregiver. STDs should be checked after treatment.  Keep all follow-up appointments, Pap tests, and blood tests as directed by your caregiver.  Only use latex condoms and water-soluble lubricants during sexual activity. Do not use  petroleum jelly or oils.  Avoid alcohol and illegal drugs.  Get vaccinated for HPV and hepatitis. If you have not received these vaccines in the past, talk to your caregiver about whether one or both might be right for you.  Avoid risky sex practices that can break the skin. The only way to avoid getting an STD is to avoid all sexual activity.Latex condoms and dental dams (for oral sex) will help lessen the risk of getting an STD, but will not completely eliminate the risk. SEEK MEDICAL CARE IF:   You have a fever.  You have any new or worsening symptoms. Document Released: 06/22/2002 Document Revised: 06/24/2011 Document Reviewed: 06/29/2010 [facility] Patient Information 2013 [employer].    Domestic Abuse You are being battered or abused if someone close to you hits, pushes, or physically hurts you in any way. You also are being abused if you are forced into activities. You are being sexually abused if you are forced to have sexual contact of any kind. You are being emotionally abused if you are made to feel worthless or if you are constantly threatened. It is important to remember that help is available. No one has the right to abuse you. PREVENTION OF FURTHER  ABUSE  Learn the warning signs of danger. This varies with situations but may include: the use of alcohol, threats, isolation from friends and family, or forced sexual contact. Leave if you feel that violence is going to occur.  If you are attacked or beaten, report it to the police so the abuse is documented. You do not have to press charges. The police can protect you while you or the attackers are leaving. Get the officer's name and badge number and a copy of the report.  Find someone you can trust and tell them what is happening to you: your caregiver, a nurse, clergy member, close friend or family member. Feeling ashamed is natural, but remember that you have done nothing wrong. No one deserves abuse. Document Released:  03/29/2000 Document Revised: 06/24/2011 Document Reviewed: 06/07/2010 ExitCare Patient Information 2013 [employer].    How Much is Too Much Alcohol? Drinking too much alcohol can cause injury, accidents, and health problems. These types of problems can include:   Car crashes.  Falls.  Family fighting (domestic violence).  Drowning.  Fights.  Injuries.  Burns.  Damage to certain organs.  Having a baby with birth defects. ONE DRINK CAN BE TOO MUCH WHEN YOU ARE:  Working.  Pregnant or breastfeeding.  Taking medicines. Ask your doctor.  Driving or planning to drive. If you or someone you know has a drinking problem, get help from a doctor.  Document Released: 01/26/2009 Document Revised: 06/24/2011 Document Reviewed: 01/26/2009 ExitCare Patient Information 2013 [employer]Care[employer].   Smoking Hazards Smoking cigarettes is extremely bad for your health. Tobacco smoke has over 200 known poisons in it. There are over 60 chemicals in tobacco smoke that cause cancer. Some of the chemicals found in cigarette smoke include:   Cyanide.  Benzene.  Formaldehyde.  Methanol (wood alcohol).  Acetylene (fuel used in welding torches).  Ammonia. Cigarette smoke also contains the poisonous gases nitrogen oxide and carbon monoxide.  Cigarette smokers have an increased risk of many serious medical problems and Smoking causes approximately:  90% of all lung cancer deaths in men.  80% of all lung cancer deaths in women.  90% of deaths from chronic obstructive lung disease. Compared with nonsmokers, smoking increases the risk of:  Coronary heart disease by 2 to 4 times.  Stroke by 2 to 4 times.  Men developing lung cancer by 23 times.  Women developing lung cancer by 13 times.  Dying from chronic obstructive lung diseases by 12 times.  . Smoking is the most preventable cause of death and disease in our society.  WHY IS SMOKING ADDICTIVE?  Nicotine is the chemical  agent in tobacco that is capable of causing addiction or dependence.  When you smoke and inhale, nicotine is absorbed rapidly into the bloodstream through your lungs. Nicotine absorbed through the lungs is capable of creating a powerful addiction. Both inhaled and non-inhaled nicotine may be addictive.  Addiction studies of cigarettes and spit tobacco show that addiction to nicotine occurs mainly during the teen years, when young people begin using tobacco products. WHAT ARE THE BENEFITS OF QUITTING?  There are many health benefits to quitting smoking.   Likelihood of developing cancer and heart disease decreases. Health improvements are seen almost immediately.  Blood pressure, pulse rate, and breathing patterns start returning to normal soon after quitting. QUITTING SMOKING   American Lung Association - 1-800-LUNGUSA  American Cancer Society - 1-800-ACS-2345 Document Released: 05/09/2004 Document Revised: 06/24/2011 Document Reviewed: 01/11/2009 ExitCare Patient Information 2013 ExitCare,   LLC.   Stress Management Stress is a state of physical or mental tension that often results from changes in your life or normal routine. Some common causes of stress are:  Death of a loved one.  Injuries or severe illnesses.  Getting fired or changing jobs.  Moving into a new home. Other causes may be:  Sexual problems.  Business or financial losses.  Taking on a large debt.  Regular conflict with someone at home or at work.  Constant tiredness from lack of sleep. It is not just bad things that are stressful. It may be stressful to:  Win the lottery.  Get married.  Buy a new car. The amount of stress that can be easily tolerated varies from person to person. Changes generally cause stress, regardless of the types of change. Too much stress can affect your health. It may lead to physical or emotional problems. Too little stress (boredom) may also become stressful. SUGGESTIONS TO  REDUCE STRESS:  Talk things over with your family and friends. It often is helpful to share your concerns and worries. If you feel your problem is serious, you may want to get help from a professional counselor.  Consider your problems one at a time instead of lumping them all together. Trying to take care of everything at once may seem impossible. List all the things you need to do and then start with the most important one. Set a goal to accomplish 2 or 3 things each day. If you expect to do too many in a single day you will naturally fail, causing you to feel even more stressed.  Do not use alcohol or drugs to relieve stress. Although you may feel better for a short time, they do not remove the problems that caused the stress. They can also be habit forming.  Exercise regularly - at least 3 times per week. Physical exercise can help to relieve that "uptight" feeling and will relax you.  The shortest distance between despair and hope is often a good night's sleep.  Go to bed and get up on time allowing yourself time for appointments without being rushed.  Take a short "time-out" period from any stressful situation that occurs during the day. Close your eyes and take some deep breaths. Starting with the muscles in your face, tense them, hold it for a few seconds, then relax. Repeat this with the muscles in your neck, shoulders, hand, stomach, back and legs.  Take good care of yourself. Eat a balanced diet and get plenty of rest.  Schedule time for having fun. Take a break from your daily routine to relax. HOME CARE INSTRUCTIONS   Call if you feel overwhelmed by your problems and feel you can no longer manage them on your own.  Return immediately if you feel like hurting yourself or someone else. Document Released: 09/25/2000 Document Revised: 06/24/2011 Document Reviewed: 05/18/2007 ExitCare Patient Information 2013 ExitCare, [employer].  
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
## 2013-11-23 LAB — STD PANEL
HEP B S AG: NEGATIVE
HIV 1&2 Ab, 4th Generation: NONREACTIVE

## 2013-11-24 LAB — IPS N GONORRHOEA AND CHLAMYDIA BY PCR

## 2013-11-25 LAB — IPS PAP TEST WITH REFLEX TO HPV

## 2013-11-28 NOTE — Progress Notes (Signed)
Encounter reviewed by Dr. Brook Silva.  

## 2013-11-29 ENCOUNTER — Telehealth: Payer: Self-pay | Admitting: Emergency Medicine

## 2013-11-29 DIAGNOSIS — IMO0002 Reserved for concepts with insufficient information to code with codable children: Secondary | ICD-10-CM

## 2013-11-29 NOTE — Telephone Encounter (Signed)
Message copied by Joeseph AmorFAST, Kerim Statzer L on Mon Nov 29, 2013  1:44 PM ------      Message from: Ria CommentGRUBB, PATRICIA R      Created: Thu Nov 25, 2013  5:45 PM       Patient needs a repeat colpo biopsy.  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

## 2013-11-29 NOTE — Telephone Encounter (Signed)
Spoke with patient. Message from Lauro FranklinPatricia Rolen-Grubb, FNP given. Patient agreeable to scheduling colposcopy.  Patient has not started Junel yet. She is waiting for cycle and will begin ocp.  Advised patient to call back on first day of cycle so that colposcopy can be scheduled in first 12 days of cycle.  Patient is agreeable and will call back with cycle to schedule and receive pre-procedure instructions then.

## 2013-12-09 NOTE — Telephone Encounter (Signed)
Spoke with patient. Advised that per benefit quote received, she will be responsible for $403.69 when she comes in for colpo. Explained that she has a $500 calendar year deductible that has not been satisfied yet and that once the deductible is met, plan pays 80/20. Also advised that there will be associated pathology charges, but that because they are billed separately by the lab , I am unable to quote those costs. Patient agreeable. Patient states that she will call back to schedule.

## 2013-12-09 NOTE — Telephone Encounter (Signed)
Left message for patient to call back. Need to go over benefits for colpo. °

## 2013-12-28 NOTE — Telephone Encounter (Signed)
Patient is ready to schedule her colpo procedure.  °

## 2013-12-28 NOTE — Telephone Encounter (Addendum)
Patient returned call. Agreeable to 12/31/13 at 1300.  Colposcopy pre-procedure instructions given. Motrin instructions given. Motrin=Advil=Ibuprofen Can take 800 mg Motrin with food. Make sure to eat a meal before appointment and drink plenty of fluids. Patient verbalized understanding and will call to reschedule if will be on menses or has any concerns regarding pregnancy. Abstain until after procedure. Advised will need to cancel within 24 hours or will have $100.00 no show fee placed to account.  Patient aware of out of pocket costs and advised will be collected day of.  Routing to provider for final review. Patient agreeable to disposition. Will close encounter  LMP 12/25/13

## 2013-12-28 NOTE — Telephone Encounter (Signed)
Left message to call Elizabeth Buck at [phone].  Offer Friday at 1pm with Dr.Silva for colposcopy (time per [name]). Will need colpo instructions.

## 2013-12-28 NOTE — Telephone Encounter (Signed)
Spoke with patient. Patient states that she started her cycle and is ready to schedule colposcopy. Patient can not schedule for Monday's due to school. 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 Patient is free on Fridays. Advised patient will have to check scheduling and give her a call back with best appointment date and time. Patient agreeable.

## 2013-12-31 ENCOUNTER — Telehealth: Payer: Self-pay | Admitting: Emergency Medicine

## 2013-12-31 ENCOUNTER — Ambulatory Visit: Payer: BC Managed Care – PPO | Admitting: Obstetrics and Gynecology

## 2013-12-31 NOTE — Telephone Encounter (Signed)
Patient here in office. She has checked in for colposcopy.  Will need to have colposcopy scheduled as provider not in office.  Patient agreeable to rescheduling. Started LMP on 12/25/13, Has not started ocp. Advised to start Junel Today and use bum if sexually active. Patient states she is not sexually active at all.  Advised will need to have colposcopy with next cycle. To please call our office on first day of next cycle. Patient is agreeable. Instructions given.  She will call with first day of next cycle for colposcopy with Dr. Edward Jolly. At that point, will have contraceptive coverage from ocp.  1 month recall entered.

## 2014-01-13 ENCOUNTER — Telehealth: Payer: Self-pay | Admitting: Obstetrics and Gynecology

## 2014-01-13 NOTE — Telephone Encounter (Signed)
Left message to call Elizabeth Buck at [phone].  Need to advise per Kennon RoundsSally patient needs to not take a pill tonight as she has already taken two today and will need to take two tomorrow and then go back to taking one per day. Will need to schedule colpo at the beginning of next pill pack after upcoming cycle.

## 2014-01-13 NOTE — Telephone Encounter (Signed)
Pt says that she missed two pill on Tuesday and Wednesday. Pt states she took both of those pills this morning. And she started bleeding now before she too the pills. Pt isnt supposed to have cycle for 2 weeks. Doesn't know what she needs to do.

## 2014-01-14 NOTE — Telephone Encounter (Signed)
Left message to call Elizabeth Buck at [phone]. 

## 2014-01-17 ENCOUNTER — Ambulatory Visit (INDEPENDENT_AMBULATORY_CARE_PROVIDER_SITE_OTHER): Payer: BC Managed Care – PPO | Admitting: Certified Nurse Midwife

## 2014-01-17 ENCOUNTER — Encounter: Payer: Self-pay | Admitting: Certified Nurse Midwife

## 2014-01-17 VITALS — BP 130/70 | HR 88 | Temp 98.4°F | Resp 20 | Wt 110.0 lb

## 2014-01-17 DIAGNOSIS — N926 Irregular menstruation, unspecified: Secondary | ICD-10-CM

## 2014-01-17 DIAGNOSIS — N946 Dysmenorrhea, unspecified: Secondary | ICD-10-CM

## 2014-01-17 LAB — HEMOGLOBIN, FINGERSTICK: Hemoglobin, fingerstick: 13.2 g/dL (ref 12.0–16.0)

## 2014-01-17 NOTE — Telephone Encounter (Signed)
Patient came to office. She is requesting appointment to be seen today.  She states she has called but not received a phone call back.  I advised her that we have attempted to return call on 01/14/14.  Patient states she is having lower pelvic pain that radiates across abdomen and that she has been having ongoing vaginal bleeding, patient does not know date of LMP. Previous records state 12/25/13. Unable to obtain assessment regarding how often needing to change pads or tampons, patient reports sometimes tampons are full, sometimes they are not.  Patient has not tried any medications to help with pain.  Offered office visit with next open provider as patient is in office, today, at 1430 with Verner Choleborah S. Leonard CNM.  Patient agreeable and is going to check in for appointment at this time.

## 2014-01-17 NOTE — Patient Instructions (Addendum)
Ethinyl Estradiol; Etonogestrel vaginal ring What is this medicine? ETHINYL ESTRADIOL; ETONOGESTREL (ETH in il es tra DYE ole; et oh noe JES trel) vaginal ring is a flexible, vaginal ring used as a contraceptive (birth control method). This medicine combines two types of female hormones, an estrogen and a progestin. This ring is used to prevent ovulation and pregnancy. Each ring is effective for one month. This medicine may be used for other purposes; ask your health care provider or pharmacist if you have questions. COMMON BRAND NAME(S): NuvaRing What should I tell my health care provider before I take this medicine? They need to know if you have or ever had any of these conditions: -abnormal vaginal bleeding -blood vessel disease or blood clots -breast, cervical, endometrial, ovarian, liver, or uterine cancer -diabetes -gallbladder disease -heart disease or recent heart attack -high blood pressure -high cholesterol -kidney disease -liver disease -migraine headaches -stroke -systemic lupus erythematosus (SLE) -tobacco smoker -an unusual or allergic reaction to estrogens, progestins, other medicines, foods, dyes, or preservatives -pregnant or trying to get pregnant -breast-feeding How should I use this medicine? Insert the ring into your vagina as directed. Follow the directions on the prescription label. The ring will remain place for 3 weeks and is then removed for a 1-week break. A new ring is inserted 1 week after the last ring was removed, on the same day of the week. Do not use more often than directed. A patient package insert for the product will be given with each prescription and refill. Read this sheet carefully each time. The sheet may change frequently. Contact your pediatrician regarding the use of this medicine in children. Special care may be needed. This medicine has been used in female children who have started having menstrual periods. Overdosage: If you think you have  taken too much of this medicine contact a poison control center or emergency room at once. NOTE: This medicine is only for you. Do not share this medicine with others. What if I miss a dose? You will need to replace your vaginal ring once a month as directed. If the ring should slip out, or if you leave it in longer or shorter than you should, contact your health care professional for advice. What may interact with this medicine? -acetaminophen -antibiotics or medicines for infections, especially rifampin, rifabutin, rifapentine, and griseofulvin, and possibly penicillins or tetracyclines -aprepitant -ascorbic acid (vitamin C) -atorvastatin -barbiturate medicines, such as phenobarbital -bosentan -carbamazepine -caffeine -clofibrate -cyclosporine -dantrolene -doxercalciferol -felbamate -grapefruit juice -hydrocortisone -medicines for anxiety or sleeping problems, such as diazepam or temazepam -medicines for diabetes, including pioglitazone -modafinil -mycophenolate -nefazodone -oxcarbazepine -phenytoin -prednisolone -ritonavir or other medicines for HIV infection or AIDS -rosuvastatin -selegiline -soy isoflavones supplements -St. John's wort -tamoxifen or raloxifene -theophylline -thyroid hormones -topiramate -warfarin This list may not describe all possible interactions. Give your health care provider a list of all the medicines, herbs, non-prescription drugs, or dietary supplements you use. Also tell them if you smoke, drink alcohol, or use illegal drugs. Some items may interact with your medicine. What should I watch for while using this medicine? Visit your doctor or health care professional for regular checks on your progress. You will need a regular breast and pelvic exam and Pap smear while on this medicine. Use an additional method of contraception during the first cycle that you use this ring. If you have any reason to think you are pregnant, stop using this  medicine right away and contact your doctor or health care professional. If   you are using this medicine for hormone related problems, it may take several cycles of use to see improvement in your condition. Smoking increases the risk of getting a blood clot or having a stroke while you are using hormonal birth control, especially if you are more than 27 years old. You are strongly advised not to smoke. This medicine can make your body retain fluid, making your fingers, hands, or ankles swell. Your blood pressure can go up. Contact your doctor or health care professional if you feel you are retaining fluid. This medicine can make you more sensitive to the sun. Keep out of the sun. If you cannot avoid being in the sun, wear protective clothing and use sunscreen. Do not use sun lamps or tanning beds/booths. If you wear contact lenses and notice visual changes, or if the lenses begin to feel uncomfortable, consult your eye care specialist. In some women, tenderness, swelling, or minor bleeding of the gums may occur. Notify your dentist if this happens. Brushing and flossing your teeth regularly may help limit this. See your dentist regularly and inform your dentist of the medicines you are taking. If you are going to have elective surgery, you may need to stop using this medicine before the surgery. Consult your health care professional for advice. This medicine does not protect you against HIV infection (AIDS) or any other sexually transmitted diseases. What side effects may I notice from receiving this medicine? Side effects that you should report to your doctor or health care professional as soon as possible: -breast tissue changes or discharge -changes in vaginal bleeding during your period or between your periods -chest pain -coughing up blood -dizziness or fainting spells -headaches or migraines -leg, arm or groin pain -severe or sudden headaches -stomach pain (severe) -sudden shortness of  breath -sudden loss of coordination, especially on one side of the body -speech problems -symptoms of vaginal infection like itching, irritation or unusual discharge -tenderness in the upper abdomen -vomiting -weakness or numbness in the arms or legs, especially on one side of the body -yellowing of the eyes or skin Side effects that usually do not require medical attention (report to your doctor or health care professional if they continue or are bothersome): -breakthrough bleeding and spotting that continues beyond the 3 initial cycles of pills -breast tenderness -mood changes, anxiety, depression, frustration, anger, or emotional outbursts -increased sensitivity to sun or ultraviolet light -nausea -skin rash, acne, or brown spots on the skin -weight gain (slight) This list may not describe all possible side effects. Call your doctor for medical advice about side effects. You may report side effects to FDA at 1-800-FDA-1088. Where should I keep my medicine? Keep out of the reach of children. Store at room temperature between 15 and 30 degrees C (59 and 86 degrees F) for up to 4 months. The product will expire after 4 months. Protect from light. Throw away any unused medicine after the expiration date. NOTE: This sheet is a summary. It may not cover all possible information. If you have questions about this medicine, talk to your doctor, pharmacist, or health care provider.  2015, Elsevier/Gold Standard. (2008-03-17 12:03:58) Menstruation Menstruation is the monthly passing of blood, tissue, fluid and mucus, also know as a period. Your body is shedding the lining of the uterus. The flow, or amount of blood, usually lasts from 3-7 days each month. Hormones control the menstrual cycle. Hormones are a chemical substance produced by endocrine glands in the body to regulate different bodily  functions. The first menstrual period may start any time between age 43 years to 16 years. However, it  usually starts around age 71 years. Some girls have regular monthly menstrual cycles right from the beginning. However, it is not unusual to have only a couple of drops of blood or spotting when you first start menstruating. It is also not unusual to have two periods a month or miss a month or two when first starting your periods. SYMPTOMS   Mild to moderate abdominal cramps.  Aching or pain in the lower back area. Symptoms may occur 5-10 days before your menstrual period starts. These symptoms are referred to as premenstrual syndrome (PMS). These symptoms can include:  Headache.  Breast tenderness and swelling.  Bloating.  Tiredness (fatigue).  Mood changes.  Craving for certain foods. These are normal signs and symptoms and can vary in severity. To help relieve these problems, ask your caregiver if you can take over-the-counter medications for pain or discomfort. If the symptoms are not controllable, see your caregiver for help.  HORMONES INVOLVED IN MENSTRUATION Menstruation comes about because of hormones produced by the pituitary gland in the brain and the ovaries that affect the uterine lining. First, the pituitary gland in the brain produces the hormone follicle stimulating hormone [facility]). FSH stimulates the ovaries to produce estrogen, which thickens the uterine lining and begins to develop an egg in the ovary. About 14 days later, the pituitary gland produces another hormone called luteinizing hormone (LH). LH causes the egg to come out of a sac in the ovary (ovulation). The empty sac on the ovary called the corpus luteum is stimulated by another hormone from the pituitary gland called luteotropin. The corpus luteum begins to produce the estrogen and progesterone hormone. The progesterone hormone prepares the lining of the uterus to have the fertilized egg (egg combined with sperm) attach to the lining of the uterus and begin to develop into a fetus. If the egg is not fertilized, the  corpus luteum stops producing estrogen and progesterone, it disappears, the lining of the uterus sloughs off and a menstrual period begins. Then the menstrual cycle starts all over again and will continue monthly unless pregnancy occurs or menopause begins. The secretion of hormones is complex. Various parts of the body become involved in many chemical activities. Female sex hormones have other functions in a woman's body as well. Estrogen increases a woman's sex drive (libido). It naturally helps body get rid of fluids (diuretic). It also aids in the process of building new bone. Therefore, maintaining hormonal health is essential to all levels of a woman's well being. These hormones are usually present in normal amounts and cause you to menstruate. It is the relationship between the (small) levels of the hormones that is critical. When the balance is upset, menstrual irregularities can occur. HOW DOES THE MENSTRUAL CYCLE HAPPEN?  Menstrual cycles vary in length from 21-35 days with an average of 29 days. The cycle begins on the first day of bleeding. At this time, the pituitary gland in the brain releases FSH that travels through the bloodstream to the ovaries. [facility] stimulates the follicles in the ovaries. This prepares the body for ovulation that occurs around the 14th day of the cycle. The ovaries produce estrogen, and this makes sure conditions are right in the uterus for implantation of the fertilized egg.  When the levels of estrogen reach a high enough level, it signals the gland in the brain (pituitary gland) to release a  surge of LH. This causes the release of the ripest egg from its follicle (ovulation). Usually only one follicle releases one egg, but sometimes more than one follicle releases an egg especially when stimulating the ovaries for in vitro fertilization. The egg can then be collected by either fallopian tube to await fertilization. The burst follicle within the ovary that is left  behind is now called the corpus luteum or "yellow body." The corpus luteum continues to give off (secrete) reduced amounts of estrogen. This closes and hardens the cervix. It dries up the mucus to the naturally infertile condition.  The corpus luteum also begins to give off greater amounts of progesterone. This causes the lining of the uterus (endometrium) to thicken even more in preparation for the fertilized egg. The egg is starting to journey down from the fallopian tube to the uterus. It also signals the ovaries to stop releasing eggs. It assists in returning the cervical mucus to its infertile state.  If the egg implants successfully into the womb lining and pregnancy occurs, progesterone levels will continue to raise. It is often this hormone that gives some pregnant women a feeling of well being, like a "natural high." Progesterone levels drop again after childbirth.  If fertilization does not occur, the corpus luteum dies, stopping the production of hormones. This sudden drop in progesterone causes the uterine lining to break down, accompanied by blood (menstruation).  This starts the cycle back at day 1. The whole process starts all over again. Woman go through this cycle every month from puberty to menopause. Women have breaks only for pregnancy and breastfeeding (lactation), unless the woman has health problems that affect the female hormone system or chooses to use oral contraceptives to have unnatural menstrual periods. HOME CARE INSTRUCTIONS   Keep track of your periods by using a calendar.  If you use tampons, get the least absorbent to avoid toxic shock syndrome.  Do not leave tampons in the vagina over night or longer than 6 hours.  Wear a sanitary pad over night.  Exercise 3-5 times a week or more.  Avoid foods and drinks that you know will make your symptoms worse before or during your period. SEEK MEDICAL CARE IF:   You develop a fever with your period.  Your periods are  lasting more than 7 days.  Your period is so heavy that you have to change pads or tampons every 30 minutes.  You develop clots with your period and never had clots before.  You cannot get relief from over-the-counter medication for your symptoms.  Your period has not started, and it has been longer than 35 days. Document Released: 03/22/2002 Document Revised: 04/06/2013 Document Reviewed: 10/29/2012 [facility] Patient Information 2015 [place], [place]. This information is not intended to replace advice given to you by your health care provider. Make sure you discuss any questions you have with your health care provider.

## 2014-01-17 NOTE — Progress Notes (Signed)
28 y.o. Single African American [id number] for evaluation of irregular bleeding with Junel 1/20  use initiated on 11/22/13 for regulation of cycle/ contraception.Patient describes cramping and large amount of bleeding today when she was preparing to leave for class at [employer]. Patient is currently on  Week 3 of pill pack with several missed pills. Bleeding began approximately ?10 days ago off and on with this first pack. Patient having trouble remembering to take pills. Previously not on any contraception due to not being sexually active. Patient did not soak through a pad today with bleeding, it was more the cramping that she was concerned about. She has taken nothing for the cramping. Menses duration 4 days with light to moderate flow prior to starting OCP. Patient now due to missing pills would like another contraceptive option.  Patient denies dizziness, nausea or abdominal pain. Has eaten today without issues. Denies STD concerns but wants to make sure none are present. No other health concerns today.  Hgb. 13.2  O: Healthy female, WD WN, in no apparent distress other than menstrual cramping Affect: normal orientation X 3  Color good Skin : warm and dry Abdomen: soft, non tender, no rebound, + bowel sounds Bladder, urethra,urethral meatus, not tender Pelvic exam: External genitalia normal, no lesions Vagina: scant blood, normal, non tender Cervix :normal appearance, no CMT, no lesions, scant blood from cervix noted Uterus: small, non tender, anteverted Adnexa:  Normal, palpable, no masses or tenderness BP 110/62   A:Normal pelvic exam  Dysmenorrhea with irregular cycle due to inconsistent use of OCP Contraception Junel 1/20 desires change  P: Discussed normal pelvic exam and bleeding is WNL with menses. Discussed management of cramping with period with Ibuprofen instead of Tylenol.  Ibuprofen 800 mg given po with fluids tolerated well Lot # [id number] B expired 3/17 Discussed the bleeding  experienced irregular was due to stopping and restarting her pills. Questions addressed at length.Patient feeling much better after discussion and medication use. Discussed other contraception options of Nexplanon, Depo Provera, Nuvaring. Patient declined information about any other than Nuvaring, will call to schedule to come in so she can try in office, declined today. Instructed to stop her pills discard pack and finish her menses now. If desires contraception will need to start with this period or next one. Patient not sexually active so not concerned at this point. Will advise. Lab :[facility]lam[facility]   RV prn as above

## 2014-01-19 NOTE — Progress Notes (Signed)
Reviewed personally.  M. Suzanne Trinidy Masterson, MD.  

## 2014-01-20 LAB — IPS N GONORRHOEA AND CHLAMYDIA BY PCR

## 2014-05-19 ENCOUNTER — Telehealth: Payer: Self-pay | Admitting: *Deleted

## 2014-05-19 DIAGNOSIS — R87610 Atypical squamous cells of undetermined significance on cytologic smear of cervix (ASC-US): Secondary | ICD-10-CM

## 2014-05-19 DIAGNOSIS — R87810 Cervical high risk human papillomavirus (HPV) DNA test positive: Principal | ICD-10-CM

## 2014-05-19 NOTE — Telephone Encounter (Signed)
Follow-up call to patient to follow-up on colpo that patient has not called back to schedule. See phone encounter from 12-31-13, was to call with onset of cycle. Patient states she thought colpo was not needed after speaking to Debbi at 01-17-14 OV. Understood that pain was related to hormones from new OCP and that tests were negative. Advised that although GC/CHL culture was negative, this does not change need for colpo. Discussed repeat pap versus colpo to follow up on previous pap result. Patient undecided. Recommend schedule as colpo, can discuss with Dr Hyacinth MeekerMiller at appointment and can change to pap if desires after speaking with MD. Patient agreeable to plan. Appointment scheduled for 05-27-14 based on patient's work schedule. Declined earlier appointment.  She is unsure when menses is due. Thinks last cycle was mid January. Not using contraception, denies any chance of pregnancy. Advised will need to reschedule if on menses. MD will review call.     Routing to provider for final review. Patient agreeable to disposition. Will close encounter

## 2014-05-24 ENCOUNTER — Telehealth: Payer: Self-pay | Admitting: Obstetrics & Gynecology

## 2014-05-24 ENCOUNTER — Telehealth: Payer: Self-pay | Admitting: Emergency Medicine

## 2014-05-24 NOTE — Telephone Encounter (Signed)
Spoke with patient. Advised that per benefit quote received, she will be responsible to pay $283.39 when she comes in for Colpo. Patient asked if she can opt to have pap versus colpo due to costs- advised that I would let her speak with a nurse regarding this. [facility]

## 2014-05-24 NOTE — Telephone Encounter (Signed)
Yes!  Ok to keep appt.

## 2014-05-24 NOTE — Telephone Encounter (Signed)
Patient spoke with Cathrine MusterSabrina Franklin to discuss benefits for colposcopy as patient is scheduled with Dr. Hyacinth MeekerMiller for 05/27/14.  Patient wishes to schedule PAP only at this time based on projected cost of procedure for colposcopy $283.39.  Advised patient that provider must review and decide if having pap only is appropriate.   Last pap smear results from 11/22/13 ASCUS with + HR HPV.   Telephone encounter with Billie RuddySally Yeakley, RN reviewed. Scheduled with Dr. Hyacinth MeekerMiller for colposcopy/consult for 05/27/14.   Patient not on contraception and not sexually active per report. Advised must have provider review and if okay, can schedule Pap smear appointment only.  Patient then decides to keep colposcopy appointment. Advised will request provider review for instructions and return call.   Dr. Hyacinth MeekerMiller, patient not using contraception. Unsure of dates for LMP.  Okay to keep appointment for colposcopy as scheduled for 05/27/14?

## 2014-05-25 NOTE — Telephone Encounter (Signed)
Returning call.

## 2014-05-25 NOTE — Telephone Encounter (Signed)
Spoke with patient. She is agreeable to keeping appointment as scheduled with Dr. Hyacinth MeekerMiller for 05/27/14.   Routing to provider for final review. Patient agreeable to disposition. Will close encounter

## 2014-05-25 NOTE — Telephone Encounter (Signed)
Message left to [facility] [phone] to discuss keeping appointment for Friday.

## 2014-05-27 ENCOUNTER — Ambulatory Visit: Payer: Self-pay | Admitting: Obstetrics & Gynecology

## 2014-05-30 ENCOUNTER — Telehealth: Payer: Self-pay | Admitting: Obstetrics & Gynecology

## 2014-05-30 NOTE — Telephone Encounter (Signed)
[facility]. Pt missed her appointment on Friday 05/27/14.

## 2014-05-31 NOTE — Telephone Encounter (Signed)
Patient states that she started her cycle on 05/29/14 and usually lasts 5 days. Patient states she wasn't aware that she was supposed to keep appointment with Dr. Hyacinth MeekerMiller as scheduled for 05/27/14. Advised we spoke about keeping appointment as scheduled, but patient states she was confused about scheduling.   Spoke with Billie RuddySally Yeakley, RN, okay to schedule patient for consult/procedure for 06/03/14 at 1600. Advised patient very important to keep appointment as scheduled. She is agreeable and noting appointment in her calendar. Advised to take motrin 800 mg one hour before with food and drink, verbalizes understanding.  Routing to provider for final review. Patient agreeable to disposition. Will close encounter

## 2014-06-03 ENCOUNTER — Ambulatory Visit (INDEPENDENT_AMBULATORY_CARE_PROVIDER_SITE_OTHER): Payer: BLUE CROSS/BLUE SHIELD | Admitting: Obstetrics & Gynecology

## 2014-06-03 DIAGNOSIS — R87610 Atypical squamous cells of undetermined significance on cytologic smear of cervix (ASC-US): Secondary | ICD-10-CM

## 2014-06-03 DIAGNOSIS — R87810 Cervical high risk human papillomavirus (HPV) DNA test positive: Secondary | ICD-10-CM

## 2014-06-03 NOTE — Progress Notes (Signed)
29 y.o. Single AAF female here for colposcopy with possible biopsies and/or ECC due to ASCUS Pap with +HR HPV obtained 11/22/13.  Pt has delayed in having her colposcopy so I am going to repeat her Pap today.  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  Pt has had different sexual partner since abnormal pap smear and dysplasia in 2012.  Pt did have neg pap with neg HR HPV 2015.     Patient's last menstrual period was 05/29/2014.          Sexually active: Yes.    The current method of family planning is condoms.     Patient has been counseled about results and procedure.  Risks and benefits have bene reviewed including immediate and/or delayed bleeding, infection, cervical scaring from procedure, possibility of needing additional follow up as well as treatment.  rare risks of missing a lesion discussed as well.  All questions answered.  Pt ready to proceed.  BP 106/68 mmHg  Pulse 70  Ht 5' 2.25" (1.581 m)  Wt 113 lb 3.2 oz (51.347 kg)  BMI 20.54 kg/m2  LMP 05/29/2014  General appearance: alert, cooperative and appears stated age Head: Normocephalic, without obvious abnormality, atraumatic Neurologic: Grossly normal  Pelvic: External genitalia:  no lesions              Urethra:  normal appearing urethra with no masses, tenderness or lesions              Bartholins and Skenes: normal                 Vagina: normal appearing vagina with normal color and discharge, no lesions              Cervix: no lesions              Pap taken: Yes.    Speculum placed.  3% acetic acid applied to cervix for >45 seconds.  Cervix visualized with both 7.5X and 15X magnification.  Green filter also used.  Lugols solution was not used.  Findings:  No AWE noted.  Small area of decreased staining at 1 o'clock.  Biopsy:  1 o'clock obtained.  ECC:  was performed.  Monsel's was needed.  Excellent hemostasis was present.  Pt tolerated procedure well and all instruments were  removed.  Findings noted above on picture of cervix.  Assessment:  ASCUS pap with +HR HPV  Plan:  Pap and biopsy pending.  Pathology results will be called to patient and follow-up planned pending results.

## 2014-06-07 LAB — IPS PAP TEST WITH REFLEX TO HPV

## 2014-06-08 LAB — IPS OTHER TISSUE BIOPSY

## 2014-06-14 NOTE — Telephone Encounter (Signed)
Patient given message from Dr. Hyacinth MeekerMiller and Dr. Edward JollySilva.  Patient verbalizes understanding of results. 08 Recall is in place.  Patient has appointment scheduled for annual exam in August with Lauro FranklinPatricia Rolen-Grubb, FNP.   Routing to provider for final review. Patient agreeable to disposition. Will close encounter

## 2014-06-14 NOTE — Telephone Encounter (Signed)
-----   Message from [place] Amundson de Gwenevere Ghaziarvalho E Silva, MD sent at 06/08/2014  7:30 PM EST ----- This is Dr. Edward JollySilva reviewing Dr. Rondel BatonMiller's inbox in her absence.  Please report results of colpo visit to patient: Pap was normal. 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   Pap smears are screening tests of the cervix and can be wrong, as in this case. The biopsies are an accurate way to confirm a diagnosis.   I expect Dr. Hyacinth MeekerMiller may recommend cotesting with pap and HR HPV in one year.   HPV testing improves the accuracy of pap testing (It was not done due to her young age.) She will make the final review and determination when she reviews these reports.  Please enter recall 08 for now.   Cc - Lorrene ReidKelly Nix

## 2014-06-14 NOTE — Telephone Encounter (Signed)
Pt returned call

## 2014-06-14 NOTE — Telephone Encounter (Signed)
-----   Message from Annamaria BootsMary Suzanne Miller, MD sent at 06/13/2014  5:57 PM EST ----- 08 recall.  Needs Pap and HR HPV 1 year.  Agree with Dr. Edward JollySilva.

## 2014-06-14 NOTE — Telephone Encounter (Signed)
Message left to return call to Elizabeth Buck at [phone].    

## 2014-07-29 ENCOUNTER — Telehealth: Payer: Self-pay | Admitting: Nurse Practitioner

## 2014-07-29 NOTE — Telephone Encounter (Signed)
LMTCB about canceled appointment °

## 2014-09-08 IMAGING — CT CT HEAD W/O CM
1 series · 16 of 30 positions shown, 20 images · non-contrast
Comparison: None.

CLINICAL DATA: Headache and memory loss post trauma

EXAM:
CT HEAD WITHOUT CONTRAST
TECHNIQUE: Contiguous axial images were obtained from the base of the skull
through the vertex without intravenous contrast.

[Series 2: head 4.8 h37s · axial · 0.42mm/px · z∈[+1188,+1321]mm · 16 of 32 slices shown, 20 images]
[im 2/32  brain]
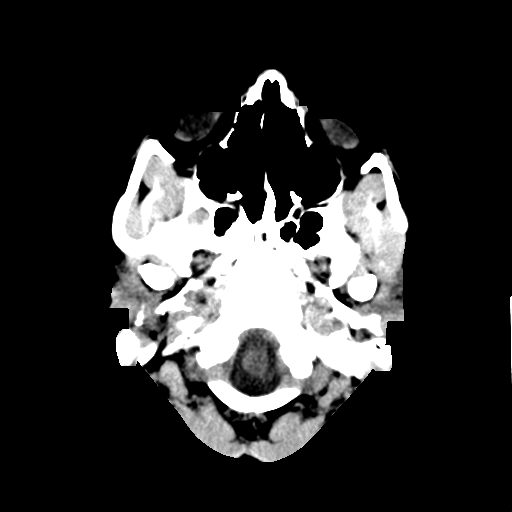
[im 2/32  bone]
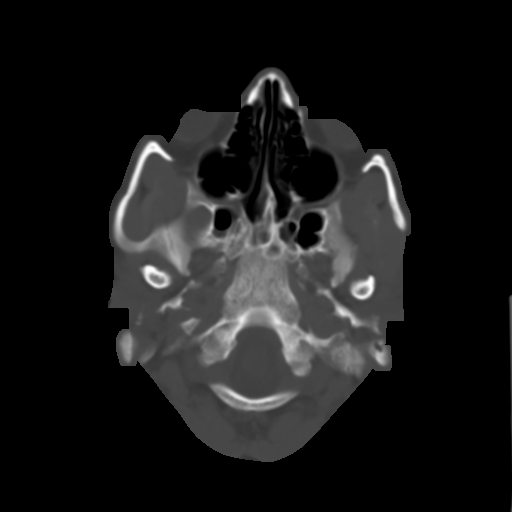
[im 4/32  brain]
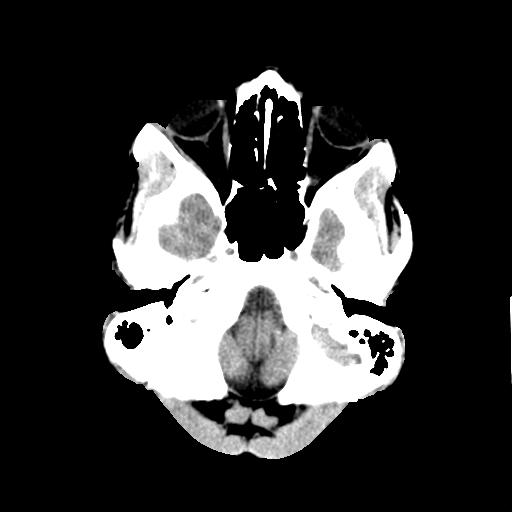
[im 6/32  brain]
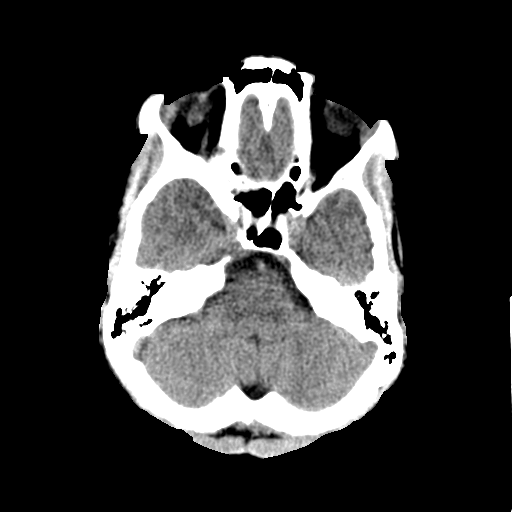
[im 8/32  brain]
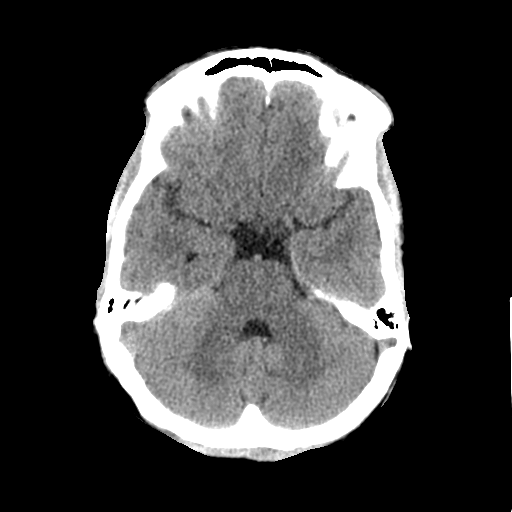
[im 9/32  brain]
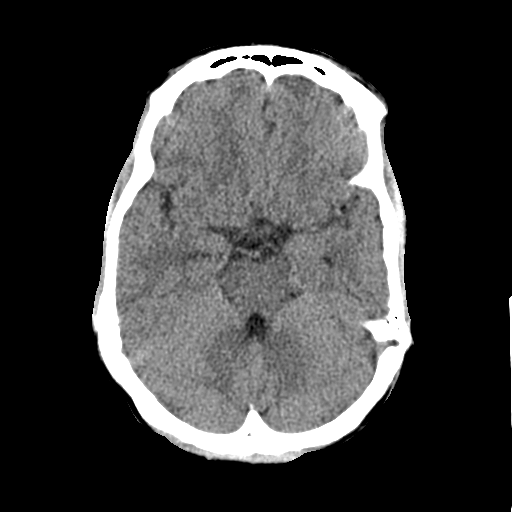
[im 9/32  bone]
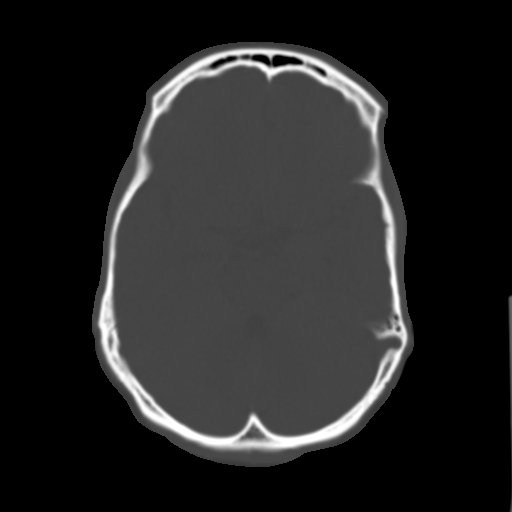
[im 11/32  brain]
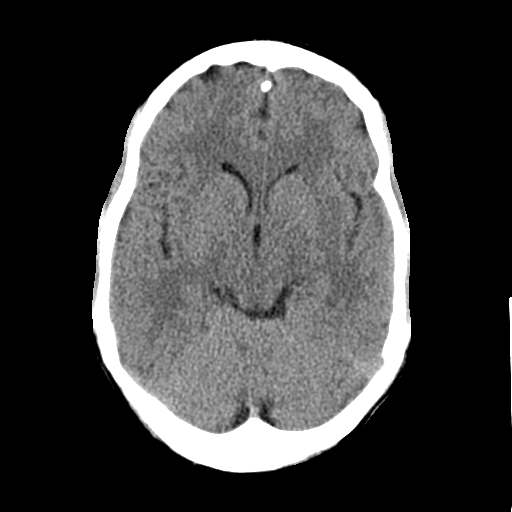
[im 13/32  brain]
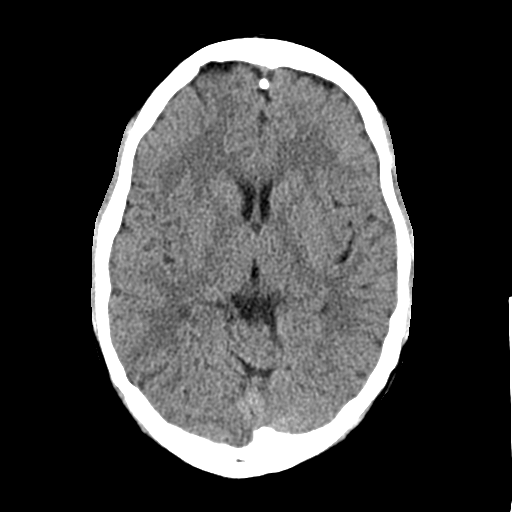
[im 15/32  brain]
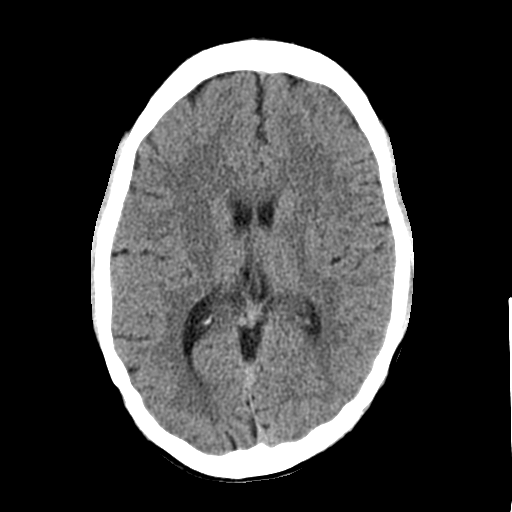
[im 17/32  brain]
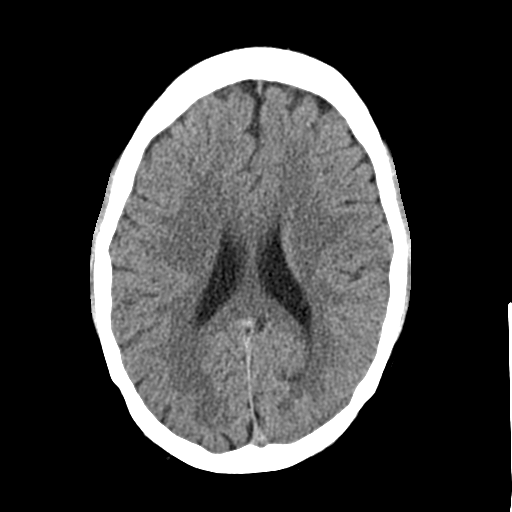
[im 17/32  bone]
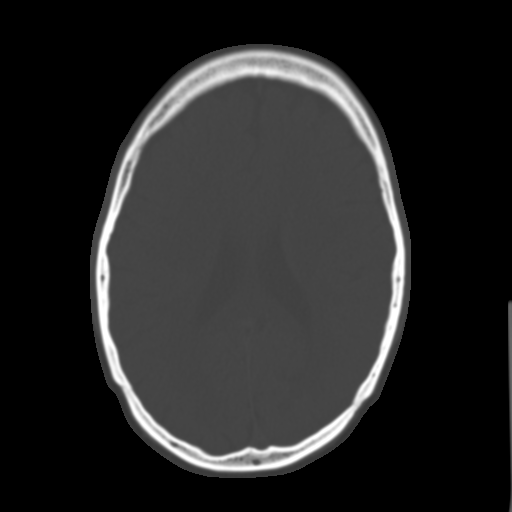
[im 19/32  brain]
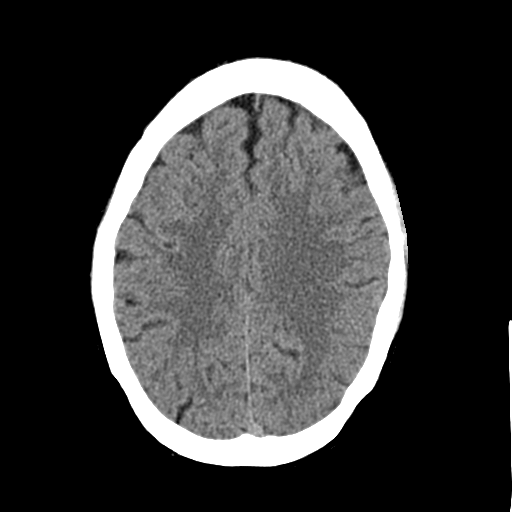
[im 21/32  brain]
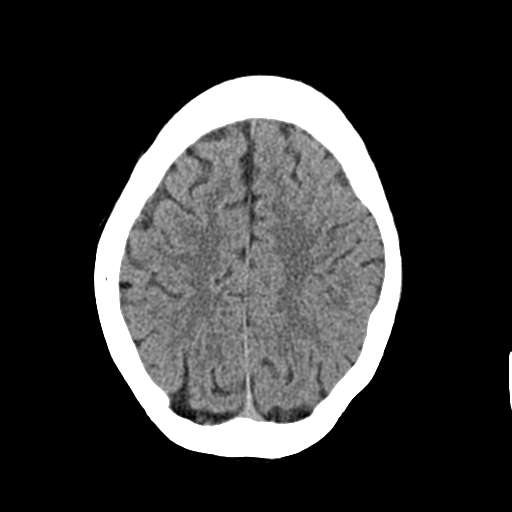
[im 23/32  brain]
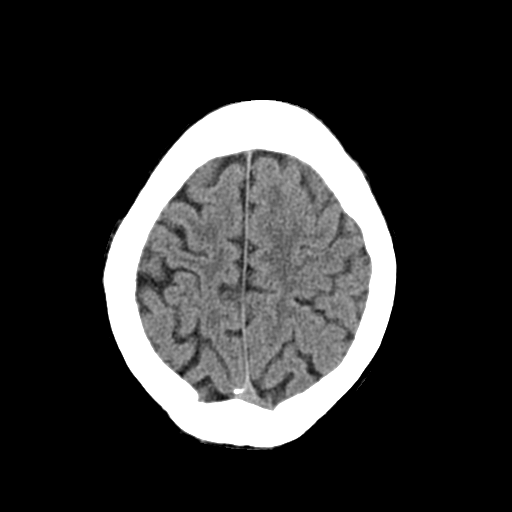
[im 24/32  brain]
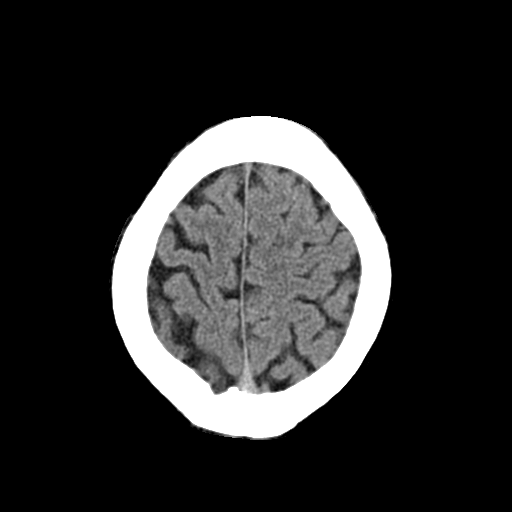
[im 24/32  bone]
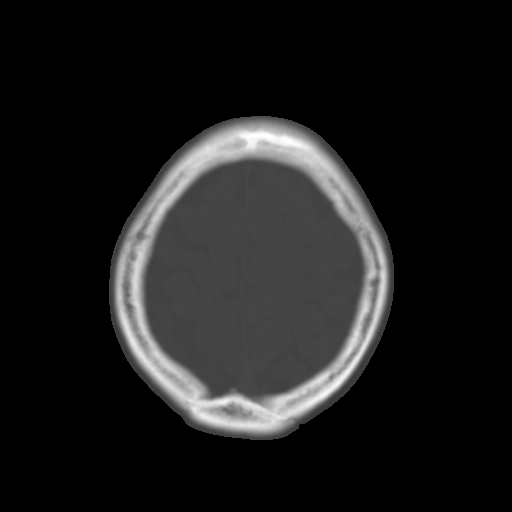
[im 26/32  brain]
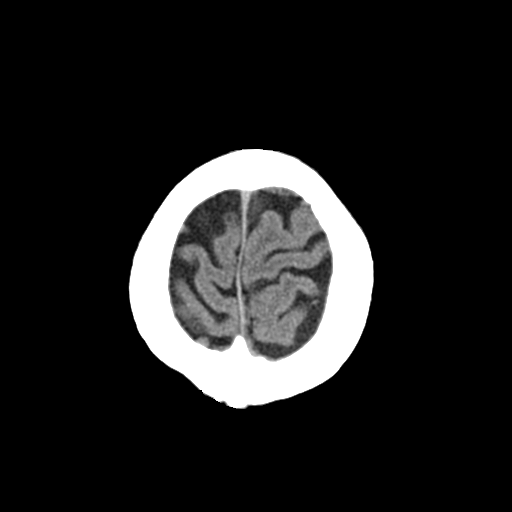
[im 28/32  brain]
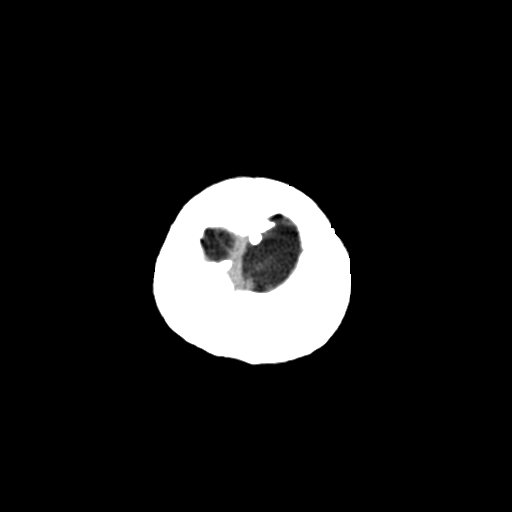
[im 30/32  brain]
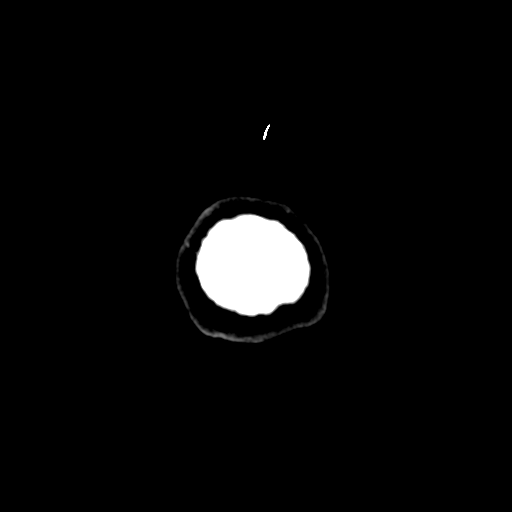

[16 of 30 positions shown; findings below may reference images not displayed]

FINDINGS: The ventricles are normal in size and configuration. There is no
mass, hemorrhage, extra-axial fluid collection, or midline shift.
Gray-white compartments are normal. No acute infarct apparent. Bony
calvarium appears intact. The mastoid air cells are clear.
IMPRESSION: Study within normal limits.

## 2014-11-24 ENCOUNTER — Ambulatory Visit: Payer: BC Managed Care – PPO | Admitting: Nurse Practitioner

## 2015-05-04 ENCOUNTER — Telehealth: Payer: Self-pay | Admitting: *Deleted

## 2015-05-04 NOTE — Telephone Encounter (Signed)
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  Message left for patient to return call to schedule annual exam.

## 2015-05-04 NOTE — Telephone Encounter (Signed)
Patient returned call. She has moved and had pap done at new doctor.

## 2015-05-04 NOTE — Telephone Encounter (Signed)
Patient has transferred care.  Patient is removed from current recall.  Encounter routed to provider for final review. Closing encounter.
